# Patient Record
Sex: Male | Born: 1985 | Race: Black or African American | Hispanic: No | Marital: Single | Smoking: Never smoker
Health system: Southern US, Community
[De-identification: ages and names within clinical notes are randomized; demographics above are authoritative.]

## PROBLEM LIST (undated history)

## (undated) ENCOUNTER — Emergency Department (HOSPITAL_BASED_OUTPATIENT_CLINIC_OR_DEPARTMENT_OTHER): Payer: Self-pay | Source: Home / Self Care

## (undated) DIAGNOSIS — F1020 Alcohol dependence, uncomplicated: Secondary | ICD-10-CM

## (undated) DIAGNOSIS — K852 Alcohol induced acute pancreatitis without necrosis or infection: Secondary | ICD-10-CM

---
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
## 2014-11-05 ENCOUNTER — Ambulatory Visit (INDEPENDENT_AMBULATORY_CARE_PROVIDER_SITE_OTHER): Payer: 59 | Admitting: Family Medicine

## 2014-11-05 ENCOUNTER — Encounter (INDEPENDENT_AMBULATORY_CARE_PROVIDER_SITE_OTHER): Payer: Self-pay

## 2014-11-05 VITALS — BP 133/85 | HR 71 | Temp 98.2°F | Resp 16 | Ht 68.0 in | Wt 180.0 lb

## 2014-11-05 DIAGNOSIS — L03031 Cellulitis of right toe: Secondary | ICD-10-CM

## 2014-11-05 MED ORDER — CEPHALEXIN 500 MG PO CAPS
500.0000 mg | ORAL_CAPSULE | Freq: Four times a day (QID) | ORAL | Status: AC
Start: 2014-11-05 — End: 2014-11-15

## 2014-11-05 NOTE — Patient Instructions (Signed)
Paronychia of theFinger or Toe  Paronychia is an infection near a fingernail or toenail. It usually occurs when an opening in the cuticle or an ingrown toenail lets bacteria under the skin.  The infection will need to be drained if pus is present. If the infection has been caught early, you may need only antibiotic treatment. Healing will take about 1 to 2 weeks.  Home care  Follow these guidelines when caring for yourself at home:   Clean and soak the toe or finger. Do this twice a day for the first 3 days. To do so:   Soak your foot or hand in a tub of warm water for 5 minutes. Or hold your toe or finger under a faucet of warm running water for 5 minutes.   Clean any crust away with soap and water using a cotton swab.   Put antibiotic ointment on the infected area.   Change the dressing daily or any time it becomes soiled.   If you were given antibiotics, take them as directed until they are all gone.   If your infection is on a toe, wear comfortable shoes with a lot of toe room. You can also wear open-toed sandals while your toe heals.   You may use acetaminophen or ibuprofen to help with pain, unless another medicine was prescribed. If you have chronic liver or kidney disease, talk with your health care provider before using these medicines. Also talk with your provider if you've had a stomach ulcer or GI bleeding.  Prevention  The following can prevent paronychia:   Trim or push down the skin around the nail (cuticle).   Don't bite your nails.   Don't suck on your thumbs or fingers.  Follow-up care  Follow up with your health care provider, or as advised.  When to seek medical advice  Call your health care provider right away if any of these occur:   Redness, pain, or swelling of the finger or toe gets worse   Red streaks in the skin leading away from the wound   Pus or fluid drain from the nail area   Fever of 100.4F (38C) or higher, or as directed by your health care provider   2000-2015  [employer] [address], [place] [address]. All rights reserved. This information is not intended as a substitute for professional medical care. Always follow your healthcare professional's instructions.

## 2014-11-05 NOTE — Progress Notes (Signed)
Subjective:       Patient ID: Justin Acevedo is a 29 y.o. male.  Chief Complaint   Patient presents with   . Nail Problem     onset yesterday 11/04/2014.  right 1st toe.  pt reports getting a pedicure now c/o swelling redness and pain.  applied neosporin and took ibuprofen.         HPI Patient is here with a possible infection of the right first toe following a pedicure that was done yesterday. States that he had some soreness after the Pedicure which he attributed to the procedure but later at night he started having worsening pain and redness. Hurts to walk.     The following portions of the patient's history were reviewed and updated as appropriate: allergies, current medications, past family history, past medical history, past social history, past surgical history and problem list.    Review of Systems   Constitutional: Negative for fever, chills, activity change and fatigue.   HENT: Negative for congestion.    Respiratory: Negative for shortness of breath.    Cardiovascular: Negative for leg swelling.   Musculoskeletal: Positive for joint swelling and gait problem. Negative for myalgias and arthralgias.        See HPI   Skin: Positive for color change. Negative for wound.   Neurological: Negative for weakness and numbness.           Objective:     Physical Exam   Nursing note and vitals reviewed.  Constitutional: He appears well-developed and well-nourished. No distress.   Eyes: Conjunctivae are normal.   Neck: Normal range of motion. Neck supple.   Pulmonary/Chest: Effort normal. No respiratory distress.   Musculoskeletal:        Right foot: There is tenderness and swelling. There is normal range of motion.        Feet:    Neurological: He is alert. He exhibits normal muscle tone.   Skin: Skin is warm. There is erythema.   Psychiatric: He has a normal mood and affect.           Assessment:       Paronychia       Plan:       Abx as prescribed.  Warm soaks  Pain control with Tylenol or Ibuprofen PRN  F/U with  PMD or here if there is no improvement or earlier if worsening.   Patient voiced understanding and agreed to the plan  AVS discussed  Work note provided.recommended to wear wide toed shoes

## 2015-09-26 DIAGNOSIS — R42 Dizziness and giddiness: Secondary | ICD-10-CM | POA: Diagnosis not present

## 2015-09-26 DIAGNOSIS — J0101 Acute recurrent maxillary sinusitis: Secondary | ICD-10-CM | POA: Diagnosis not present

## 2015-09-26 DIAGNOSIS — E162 Hypoglycemia, unspecified: Secondary | ICD-10-CM | POA: Diagnosis not present

## 2015-10-17 DIAGNOSIS — G4700 Insomnia, unspecified: Secondary | ICD-10-CM | POA: Diagnosis not present

## 2015-10-17 DIAGNOSIS — I10 Essential (primary) hypertension: Secondary | ICD-10-CM | POA: Diagnosis not present

## 2015-11-15 DIAGNOSIS — I10 Essential (primary) hypertension: Secondary | ICD-10-CM | POA: Diagnosis not present

## 2015-11-15 DIAGNOSIS — E8881 Metabolic syndrome: Secondary | ICD-10-CM | POA: Diagnosis not present

## 2015-11-18 DIAGNOSIS — I10 Essential (primary) hypertension: Secondary | ICD-10-CM | POA: Diagnosis not present

## 2015-11-18 DIAGNOSIS — D72820 Lymphocytosis (symptomatic): Secondary | ICD-10-CM | POA: Diagnosis not present

## 2015-11-18 DIAGNOSIS — E785 Hyperlipidemia, unspecified: Secondary | ICD-10-CM | POA: Diagnosis not present

## 2015-11-19 DIAGNOSIS — B182 Chronic viral hepatitis C: Secondary | ICD-10-CM | POA: Diagnosis not present

## 2015-11-19 DIAGNOSIS — R700 Elevated erythrocyte sedimentation rate: Secondary | ICD-10-CM | POA: Diagnosis not present

## 2015-11-19 DIAGNOSIS — Z717 Human immunodeficiency virus [HIV] counseling: Secondary | ICD-10-CM | POA: Diagnosis not present

## 2016-04-30 DIAGNOSIS — I10 Essential (primary) hypertension: Secondary | ICD-10-CM | POA: Diagnosis not present

## 2016-04-30 DIAGNOSIS — J018 Other acute sinusitis: Secondary | ICD-10-CM | POA: Diagnosis not present

## 2016-05-11 DIAGNOSIS — J111 Influenza due to unidentified influenza virus with other respiratory manifestations: Secondary | ICD-10-CM | POA: Diagnosis not present

## 2016-05-11 DIAGNOSIS — T50905A Adverse effect of unspecified drugs, medicaments and biological substances, initial encounter: Secondary | ICD-10-CM | POA: Diagnosis not present

## 2016-05-11 DIAGNOSIS — J40 Bronchitis, not specified as acute or chronic: Secondary | ICD-10-CM | POA: Diagnosis not present

## 2016-10-16 DIAGNOSIS — I10 Essential (primary) hypertension: Secondary | ICD-10-CM | POA: Diagnosis not present

## 2016-10-16 DIAGNOSIS — Z6825 Body mass index (BMI) 25.0-25.9, adult: Secondary | ICD-10-CM | POA: Diagnosis not present

## 2016-10-16 DIAGNOSIS — J309 Allergic rhinitis, unspecified: Secondary | ICD-10-CM | POA: Diagnosis not present

## 2016-12-08 DIAGNOSIS — Z0000 Encounter for general adult medical examination without abnormal findings: Secondary | ICD-10-CM | POA: Diagnosis not present

## 2016-12-08 DIAGNOSIS — Z1389 Encounter for screening for other disorder: Secondary | ICD-10-CM | POA: Diagnosis not present

## 2016-12-08 DIAGNOSIS — Z23 Encounter for immunization: Secondary | ICD-10-CM | POA: Diagnosis not present

## 2016-12-08 DIAGNOSIS — Z6827 Body mass index (BMI) 27.0-27.9, adult: Secondary | ICD-10-CM | POA: Diagnosis not present

## 2017-01-11 DIAGNOSIS — B9689 Other specified bacterial agents as the cause of diseases classified elsewhere: Secondary | ICD-10-CM | POA: Diagnosis not present

## 2017-01-11 DIAGNOSIS — L0202 Furuncle of face: Secondary | ICD-10-CM | POA: Diagnosis not present

## 2017-12-17 DIAGNOSIS — J019 Acute sinusitis, unspecified: Secondary | ICD-10-CM | POA: Diagnosis not present

## 2017-12-17 DIAGNOSIS — Z6829 Body mass index (BMI) 29.0-29.9, adult: Secondary | ICD-10-CM | POA: Diagnosis not present

## 2018-03-14 DIAGNOSIS — Z6831 Body mass index (BMI) 31.0-31.9, adult: Secondary | ICD-10-CM | POA: Diagnosis not present

## 2018-03-14 DIAGNOSIS — J0101 Acute recurrent maxillary sinusitis: Secondary | ICD-10-CM | POA: Diagnosis not present

## 2018-03-25 ENCOUNTER — Encounter: Payer: Self-pay | Admitting: Allergy & Immunology

## 2018-03-25 ENCOUNTER — Ambulatory Visit (INDEPENDENT_AMBULATORY_CARE_PROVIDER_SITE_OTHER): Payer: BLUE CROSS/BLUE SHIELD | Admitting: Allergy & Immunology

## 2018-03-25 VITALS — BP 140/82 | HR 74 | Temp 98.4°F | Resp 18 | Ht 66.75 in | Wt 203.0 lb

## 2018-03-25 DIAGNOSIS — J302 Other seasonal allergic rhinitis: Secondary | ICD-10-CM

## 2018-03-25 DIAGNOSIS — T485X5A Adverse effect of other anti-common-cold drugs, initial encounter: Secondary | ICD-10-CM

## 2018-03-25 DIAGNOSIS — J3089 Other allergic rhinitis: Secondary | ICD-10-CM

## 2018-03-25 DIAGNOSIS — J310 Chronic rhinitis: Secondary | ICD-10-CM | POA: Diagnosis not present

## 2018-03-25 MED ORDER — AZELASTINE HCL 0.1 % NA SOLN: 2.0000 | Freq: Two times a day (BID) | NASAL | 5 refills | Status: DC

## 2018-03-25 MED ORDER — MONTELUKAST SODIUM 10 MG PO TABS: 10.0000 mg | ORAL_TABLET | Freq: Every day | ORAL | 5 refills | Status: DC

## 2018-03-25 NOTE — Patient Instructions (Addendum)
1. Seasonal and perennial allergic rhinitis - Testing today showed: ragweed, weeds, grasses, indoor molds, outdoor molds, cat and dog - Copy of test results provided.  - Avoidance measures provided. - Continue with: Flonase (fluticasone) two sprays per nostril daily - Start taking: Singulair (montelukast) 10mg  daily and Astelin (azelastine) 2 sprays per nostril 1-2 times daily as needed - You can use an extra dose of the antihistamine, if needed, for breakthrough symptoms.  - Consider nasal saline rinses 1-2 times daily to remove allergens from the nasal cavities as well as help with mucous clearance (this is especially helpful to do before the nasal sprays are given) - Consider allergy shots as a means of long-term control. - Allergy shots "re-train" and "reset" the immune system to ignore environmental allergens and decrease the resulting immune response to those allergens (sneezing, itchy watery eyes, runny nose, nasal congestion, etc).    - Allergy shots improve symptoms in 75-85% of patients.  - We can discuss more at the next appointment if the medications are not working for you.  2. Rhinitis medicamentosa - We are going to have to wean the Afrin since you have been on it for so long.  - Use Afrin daily for two weeks. - Then use Afrin every other day for two weeks. - Then use Afrin three times weekly (Monday/Wednesday/Friday) for two weeks. - Then STOP.   3. Return in about 3 months (around 06/24/2018).    Please inform us of any Emergency Department visits, hospitalizations, or changes in symptoms. Call us before going to the ED for breathing or allergy symptoms since we might be able to fit you in for a sick visit. Feel free to contact us anytime with any questions, problems, or concerns.  It was a pleasure to meet you today!  Websites that have reliable patient information: 1. American Academy of Asthma, Allergy, and Immunology: www.aaaai.org 2. Food Allergy Research and  Education (FARE): foodallergy.org 3. Mothers of Asthmatics: http://www.asthmacommunitynetwork.org 4. American College of Allergy, Asthma, and Immunology: MissingWeapons.cawww.acaai.org   Make sure you are registered to vote! If you have moved or changed any of your contact information, you will need to get this updated before voting!      Reducing Pollen Exposure  The American Academy of Allergy, Asthma and Immunology suggests the following steps to reduce your exposure to pollen during allergy seasons.    1. Do not hang sheets or clothing out to dry; pollen may collect on these items. 2. Do not mow lawns or spend time around freshly cut grass; mowing stirs up pollen. 3. Keep windows closed at night.  Keep car windows closed while driving. 4. Minimize morning activities outdoors, a time when pollen counts are usually at their highest. 5. Stay indoors as much as possible when pollen counts or humidity is high and on windy days when pollen tends to remain in the air longer. 6. Use air conditioning when possible.  Many air conditioners have filters that trap the pollen spores. 7. Use a HEPA room air filter to remove pollen form the indoor air you breathe.  Control of Mold Allergen   Mold and fungi can grow on a variety of surfaces provided certain temperature and moisture conditions exist.  Outdoor molds grow on plants, decaying vegetation and soil.  The major outdoor mold, Alternaria and Cladosporium, are found in very high numbers during hot and dry conditions.  Generally, a late Summer - Fall peak is seen for common outdoor fungal spores.  Rain will  temporarily lower outdoor mold spore count, but counts rise rapidly when the rainy period ends.  The most important indoor molds are Aspergillus and Penicillium.  Dark, humid and poorly ventilated basements are ideal sites for mold growth.  The next most common sites of mold growth are the bathroom and the kitchen.  Outdoor (Seasonal) Mold Control  Positive  outdoor molds via skin testing: Alternaria, Cladosporium, Bipolaris (Helminthsporium), Drechslera (Curvalaria) and Mucor  1. Use air conditioning and keep windows closed 2. Avoid exposure to decaying vegetation. 3. Avoid leaf raking. 4. Avoid grain handling. 5. Consider wearing a face mask if working in moldy areas.  6.   Indoor (Perennial) Mold Control   Positive indoor molds via skin testing: Aspergillus, Penicillium, Fusarium, Aureobasidium (Pullulara) and Rhizopus  1. Maintain humidity below 50%. 2. Clean washable surfaces with 5% bleach solution. 3. Remove sources e.g. contaminated carpets.     Control of Dog or Cat Allergen  Avoidance is the best way to manage a dog or cat allergy. If you have a dog or cat and are allergic to dog or cats, consider removing the dog or cat from the home. If you have a dog or cat but don't want to find it a new home, or if your family wants a pet even though someone in the household is allergic, here are some strategies that may help keep symptoms at bay:  1. Keep the pet out of your bedroom and restrict it to only a few rooms. Be advised that keeping the dog or cat in only one room will not limit the allergens to that room. 2. Don't pet, hug or kiss the dog or cat; if you do, wash your hands with soap and water. 3. High-efficiency particulate air (HEPA) cleaners run continuously in a bedroom or living room can reduce allergen levels over time. 4. Regular use of a high-efficiency vacuum cleaner or a central vacuum can reduce allergen levels. 5. Giving your dog or cat a bath at least once a week can reduce airborne allergen.  Allergy Shots   Allergies are the result of a chain reaction that starts in the immune system. Your immune system controls how your body defends itself. For instance, if you have an allergy to pollen, your immune system identifies pollen as an invader or allergen. Your immune system overreacts by producing antibodies called  Immunoglobulin E (IgE). These antibodies travel to cells that release chemicals, causing an allergic reaction.  The concept behind allergy immunotherapy, whether it is received in the form of shots or tablets, is that the immune system can be desensitized to specific allergens that trigger allergy symptoms. Although it requires time and patience, the payback can be long-term relief.  How Do Allergy Shots Work?  Allergy shots work much like a vaccine. Your body responds to injected amounts of a particular allergen given in increasing doses, eventually developing a resistance and tolerance to it. Allergy shots can lead to decreased, minimal or no allergy symptoms.  There generally are two phases: build-up and maintenance. Build-up often ranges from three to six months and involves receiving injections with increasing amounts of the allergens. The shots are typically given once or twice a week, though more rapid build-up schedules are sometimes used.  The maintenance phase begins when the most effective dose is reached. This dose is different for each person, depending on how allergic you are and your response to the build-up injections. Once the maintenance dose is reached, there are longer periods between injections,  typically two to four weeks.  Occasionally doctors give cortisone-type shots that can temporarily reduce allergy symptoms. These types of shots are different and should not be confused with allergy immunotherapy shots.  Who Can Be Treated with Allergy Shots?  Allergy shots may be a good treatment approach for people with allergic rhinitis (hay fever), allergic asthma, conjunctivitis (eye allergy) or stinging insect allergy.   Before deciding to begin allergy shots, you should consider:  . The length of allergy season and the severity of your symptoms . Whether medications and/or changes to your environment can control your symptoms . Your desire to avoid long-term medication use .  Time: allergy immunotherapy requires a major time commitment . Cost: may vary depending on your insurance coverage  Allergy shots for children age 64 and older are effective and often well tolerated. They might prevent the onset of new allergen sensitivities or the progression to asthma.  Allergy shots are not started on patients who are pregnant but can be continued on patients who become pregnant while receiving them. In some patients with other medical conditions or who take certain common medications, allergy shots may be of risk. It is important to mention other medications you talk to your allergist.   When Will I Feel Better?  Some may experience decreased allergy symptoms during the build-up phase. For others, it may take as long as 12 months on the maintenance dose. If there is no improvement after a year of maintenance, your allergist will discuss other treatment options with you.  If you aren't responding to allergy shots, it may be because there is not enough dose of the allergen in your vaccine or there are missing allergens that were not identified during your allergy testing. Other reasons could be that there are high levels of the allergen in your environment or major exposure to non-allergic triggers like tobacco smoke.  What Is the Length of Treatment?  Once the maintenance dose is reached, allergy shots are generally continued for three to five years. The decision to stop should be discussed with your allergist at that time. Some people may experience a permanent reduction of allergy symptoms. Others may relapse and a longer course of allergy shots can be considered.  What Are the Possible Reactions?  The two types of adverse reactions that can occur with allergy shots are local and systemic. Common local reactions include very mild redness and swelling at the injection site, which can happen immediately or several hours after. A systemic reaction, which is less common,  affects the entire body or a particular body system. They are usually mild and typically respond quickly to medications. Signs include increased allergy symptoms such as sneezing, a stuffy nose or hives.  Rarely, a serious systemic reaction called anaphylaxis can develop. Symptoms include swelling in the throat, wheezing, a feeling of tightness in the chest, nausea or dizziness. Most serious systemic reactions develop within 30 minutes of allergy shots. This is why it is strongly recommended you wait in your doctor's office for 30 minutes after your injections. Your allergist is trained to watch for reactions, and his or her staff is trained and equipped with the proper medications to identify and treat them.  Who Should Administer Allergy Shots?  The preferred location for receiving shots is your prescribing allergist's office. Injections can sometimes be given at another facility where the physician and staff are trained to recognize and treat reactions, and have received instructions by your prescribing allergist.

## 2018-03-25 NOTE — Progress Notes (Signed)
NEW PATIENT  Date of Service/Encounter:  03/25/18  Referring provider: Richardean Prince, Angel G, MD   Assessment:   Seasonal and perennial allergic rhinitis (ragweed, weeds, grasses, indoor molds, outdoor molds, cat and dog)  Rhinitis medicamentosa  Plan/Recommendations:   1. Seasonal and perennial allergic rhinitis - Testing today showed: ragweed, weeds, grasses, indoor molds, outdoor molds, cat and dog - Copy of test results provided.  - Avoidance measures provided. - Continue with: Flonase (fluticasone) two sprays per nostril daily - Start taking: Singulair (montelukast) 10mg  daily and Astelin (azelastine) 2 sprays per nostril 1-2 times daily as needed - You can use an extra dose of the antihistamine, if needed, for breakthrough symptoms.  - Consider nasal saline rinses 1-2 times daily to remove allergens from the nasal cavities as well as help with mucous clearance (this is especially helpful to do before the nasal sprays are given) - Consider allergy shots as a means of long-term control. - Allergy shots "re-train" and "reset" the immune system to ignore environmental allergens and decrease the resulting immune response to those allergens (sneezing, itchy watery eyes, runny nose, nasal congestion, etc).    - Allergy shots improve symptoms in 75-85% of patients.  - We can discuss more at the next appointment if the medications are not working for you.  2. Rhinitis medicamentosa - We are going to have to wean the Afrin since you have been on it for so long.  - Use Afrin daily for two weeks. - Then use Afrin every other day for two weeks. - Then use Afrin three times weekly (Monday/Wednesday/Friday) for two weeks. - Then STOP.   3. Return in about 3 months (around 06/24/2018).  Subjective:   Angel Prince is a 32 y.o. male presenting today for evaluation of  Chief Complaint  Patient presents with  . Nasal Congestion  . Sinusitis    Angel Prince has a history of the  following: Patient Active Problem List   Diagnosis Date Noted  . Seasonal and perennial allergic rhinitis 03/26/2018  . Rhinitis medicamentosa 03/26/2018    History obtained from: chart review and patient.  Angel Prince was referred by Angel Prince, Angel G, MD.     Angel Prince is a 32 y.o. male presenting for an evaluation of chronic rhinitis and chronic Afrin use. He has been using this for quite some time. He is using the Afrin up to multiple times daily. He has had nasal congestion for a few years. He did not really have allergies when he was a child. He is from this area. He is on Flonase every day as well. He did start this recently within the last few months. Symptoms are always fairly terrible. He is not us9ing any antihistamines since these have not seemed ot have helped.  He is currently on antibiotics and prednisone for a sinus infections. Symptoms started two weeks ago. He has been treated twice for a sinus infection in the last twelve months. He has no history of other infections.   Angel Prince has never needed an inhaler. He tolerates all of the major food allergens without adverse event.  Otherwise, there is no history of other atopic diseases, including asthma, food allergies, drug allergies, stinging insect allergies, eczema or urticaria. There is no significant infectious history. Vaccinations are up to date.    Past Medical History: Patient Active Problem List   Diagnosis Date Noted  . Seasonal and perennial allergic rhinitis 03/26/2018  . Rhinitis medicamentosa 03/26/2018    Medication List:  Allergies  as of 03/25/2018   Not on File     Medication List        Accurate as of 03/25/18 11:59 PM. Always use your most recent med list.          azelastine 0.1 % nasal spray Commonly known as:  ASTELIN Place 2 sprays into both nostrils 2 (two) times daily.   fluticasone 50 MCG/ACT nasal spray Commonly known as:  FLONASE Place 2 sprays into both nostrils 2 (two) times daily.    montelukast 10 MG tablet Commonly known as:  SINGULAIR Take 1 tablet (10 mg total) by mouth at bedtime.   oxymetazoline 0.05 % nasal spray Commonly known as:  AFRIN Place 2 sprays into both nostrils 2 (two) times daily.       Birth History: non-contributory  Developmental History: non-contributory.   Past Surgical History: History reviewed. No pertinent surgical history.   Family History: History reviewed. No pertinent family history.   Social History: Angel Prince lives at home. He lives in a house that is 30 years old. There are wood floors throughout the home. There is gas and electric heating with window units for cooling. There are dogs inside and outside of the home. There are no dust mite coverings on the bedding. There is no tobacco exposure. He currently works in a factor that [employer]. He is not aware of the chemicals used, but he does note that symptoms are better on the weekends.    Review of Systems: a 14-point review of systems is pertinent for what is mentioned in HPI.  Otherwise, all other systems were negative. Constitutional: negative other than that listed in the HPI Eyes: negative other than that listed in the HPI Ears, nose, mouth, throat, and face: negative other than that listed in the HPI Respiratory: negative other than that listed in the HPI Cardiovascular: negative other than that listed in the HPI Gastrointestinal: negative other than that listed in the HPI Genitourinary: negative other than that listed in the HPI Integument: negative other than that listed in the HPI Hematologic: negative other than that listed in the HPI Musculoskeletal: negative other than that listed in the HPI Neurological: negative other than that listed in the HPI Allergy/Immunologic: negative other than that listed in the HPI    Objective:   Blood pressure 140/82, pulse 74, temperature 98.4 F (36.9 C), temperature source Oral, resp. rate 18, height 5' 6.75" (1.695  m), weight 203 lb (92.1 kg), SpO2 98 %. Body mass index is 32.03 kg/m.   Physical Exam:  General: Alert, interactive, in no acute distress. Pleasant and talkative.  Eyes: No conjunctival injection bilaterally, no discharge on the right, no discharge on the left and no Horner-Trantas dots present. PERRL bilaterally. EOMI without pain. No photophobia.  Ears: Right TM pearly gray with normal light reflex, Left TM pearly gray with normal light reflex, Right TM intact without perforation and Left TM intact without perforation.  Nose/Throat: External nose within normal limits and septum midline. Turbinates edematous with clear discharge. Posterior oropharynx mildly erythematous without cobblestoning in the posterior oropharynx. Tonsils 2+ without exudates.  Tongue without thrush. Neck: Supple without thyromegaly. Trachea midline. Adenopathy: no enlarged lymph nodes appreciated in the anterior cervical, occipital, axillary, epitrochlear, inguinal, or popliteal regions. Lungs: Clear to auscultation without wheezing, rhonchi or rales. No increased work of breathing. CV: Normal S1/S2. No murmurs. Capillary refill <2 seconds.  Abdomen: Nondistended, nontender. No guarding or rebound tenderness. Bowel sounds absent   Skin: Warm and dry,  without lesions or rashes. Extremities:  No clubbing, cyanosis or edema. Neuro:   Grossly intact. No focal deficits appreciated. Responsive to questions.  Diagnostic studies:     Allergy Studies:  Airborne Adult Perc - 03/25/18 1426    Time Antigen Placed  1435    Allergen Manufacturer  Waynette Buttery    Location  Back    Number of Test  59    Panel 1  Select    1. Control-Buffer 50% Glycerol  Negative    2. Control-Histamine 1 mg/ml  2+    3. Albumin saline  Negative    4. Bahia  Negative    5. [place]  Negative    6. Johnson  Negative    7. Kentucky Blue  Negative    8. Meadow Fescue  Negative    9. Perennial Rye  Negative    10. Sweet Vernal  Negative    11.  Timothy  Negative    12. Cocklebur  Negative    13. Burweed Marshelder  Negative    14. Ragweed, short  Negative    15. Ragweed, Giant  Negative    16. Plantain,  English  Negative    17. Lamb's Quarters  Negative    18. Sheep Sorrell  Negative    19. Rough Pigweed  Negative    20. Marsh Elder, Rough  Negative    21. Mugwort, Common  Negative    22. Ash mix  Negative    23. Birch mix  Negative    24. Beech American  Negative    25. Box, Elder  Negative    26. Cedar, red  Negative    27. Cottonwood, Guinea-Bissau  Negative    28. Elm mix  Negative    29. Hickory mix  Negative    30. Maple mix  Negative    31. Oak, Guinea-Bissau mix  Negative    32. Pecan Pollen  Negative    33. Pine mix  Negative    34. Sycamore Eastern  Negative    35. Walnut, Black Pollen  Negative    36. Alternaria alternata  Negative    37. Cladosporium Herbarum  Negative    38. Aspergillus mix  Negative    39. Penicillium mix  Negative    40. Bipolaris sorokiniana (Helminthosporium)  Negative    41. Drechslera spicifera (Curvularia)  Negative    42. Mucor plumbeus  Negative    43. Fusarium moniliforme  Negative    44. Aureobasidium pullulans (pullulara)  Negative    45. Rhizopus oryzae  Negative    46. Botrytis cinera  Negative    47. Epicoccum nigrum  Negative    48. Phoma betae  Negative    49. Candida Albicans  Negative    50. Trichophyton mentagrophytes  Negative    51. Mite, D Farinae  5,000 AU/ml  Negative    52. Mite, D Pteronyssinus  5,000 AU/ml  Negative    53. Cat Hair 10,000 BAU/ml  Negative    54.  Dog Epithelia  Negative    55. Mixed Feathers  Negative    56. Horse Epithelia  Negative    57. Cockroach, German  Negative    58. Mouse  Negative    59. Tobacco Leaf  Negative     Intradermal - 03/25/18 1456    Time Antigen Placed  1507    Allergen Manufacturer  Waynette Buttery    Location  Arm    Number of Test  15  Intradermal  Select    Control  Negative    [place]  1+    Johnson  1+    7 Grass   1+    Ragweed mix  1+    Weed mix  Negative    Tree mix  Negative    Mold 1  1+    Mold 2  1+    Mold 3  1+    Mold 4  3+    Cat  2+    Dog  3+    Cockroach  Negative    Mite mix  Negative        Allergy testing results were read and interpreted by myself, documented by clinical staff.       Malachi Bonds, MD [facility]

## 2018-03-26 ENCOUNTER — Encounter: Payer: Self-pay | Admitting: Allergy & Immunology

## 2018-03-26 DIAGNOSIS — T485X5A Adverse effect of other anti-common-cold drugs, initial encounter: Secondary | ICD-10-CM

## 2018-03-26 DIAGNOSIS — J3089 Other allergic rhinitis: Principal | ICD-10-CM

## 2018-03-26 DIAGNOSIS — J302 Other seasonal allergic rhinitis: Secondary | ICD-10-CM | POA: Insufficient documentation

## 2018-03-26 DIAGNOSIS — J310 Chronic rhinitis: Secondary | ICD-10-CM | POA: Insufficient documentation

## 2018-06-24 ENCOUNTER — Ambulatory Visit: Payer: BLUE CROSS/BLUE SHIELD | Admitting: Allergy & Immunology

## 2018-06-29 ENCOUNTER — Ambulatory Visit (INDEPENDENT_AMBULATORY_CARE_PROVIDER_SITE_OTHER): Payer: BLUE CROSS/BLUE SHIELD | Admitting: Allergy & Immunology

## 2018-06-29 ENCOUNTER — Encounter: Payer: Self-pay | Admitting: Allergy & Immunology

## 2018-06-29 ENCOUNTER — Other Ambulatory Visit: Payer: Self-pay

## 2018-06-29 VITALS — BP 116/70 | HR 71 | Resp 16

## 2018-06-29 DIAGNOSIS — J3089 Other allergic rhinitis: Secondary | ICD-10-CM

## 2018-06-29 DIAGNOSIS — J302 Other seasonal allergic rhinitis: Secondary | ICD-10-CM

## 2018-06-29 DIAGNOSIS — J310 Chronic rhinitis: Secondary | ICD-10-CM

## 2018-06-29 DIAGNOSIS — T485X5A Adverse effect of other anti-common-cold drugs, initial encounter: Secondary | ICD-10-CM | POA: Diagnosis not present

## 2018-06-29 NOTE — Progress Notes (Signed)
FOLLOW UP  Date of Service/Encounter:  06/29/18   Assessment:   Seasonal and perennial allergic rhinitis (ragweed, weeds, grasses, indoor molds, outdoor molds, cat and dog)  Rhinitis medicamentosa - improved   [name] is doing much better today.  He is no longer using his Afrin on a hourly basis.  His combination of nasal sprays has worked well.  Although he does use Afrin a few times per week, I think it is much better than it was before.  His symptoms seem well controlled with the current combination of medications, so we can avoid allergen immunotherapy at this time.  We can discuss this again in the future if indicated.   Plan/Recommendations:   1. Seasonal and perennial allergic rhinitis (ragweed, weeds, grasses, indoor molds, outdoor molds, cat and dog) - Continue with: Singulair (montelukast) 10mg  daily, Flonase (fluticasone) two sprays per nostril daily and Astelin (azelastine) 2 sprays per nostril 1-2 times daily as needed - Use salt water rinses as needed.  - We can avoid allergy shots for now, but we can revisit that in the future if needed.   3. Return in about 1 year (around 06/29/2019).  Subjective:   Angel Prince is a 33 y.o. male presenting today for follow up of  Chief Complaint  Patient presents with  . Follow-up    [name] has a history of the following: Patient Active Problem List   Diagnosis Date Noted  . Seasonal and perennial allergic rhinitis 03/26/2018  . Rhinitis medicamentosa 03/26/2018    History obtained from: chart review and patient.  Angel Prince is a 33 y.o. male presenting for a follow up visit.  He was last seen in December 2019.  At that time, he had testing that was positive to ragweed, weeds, grasses, indoor molds, outdoor molds, cat, and dog.  We continued Flonase and added Astelin and Singulair.  He was using Afrin quite a bit, so we recommended weaning that over the course of a month.  Since last visit, he has done very  well.  He is using the fluticasone and Astelin as recommended.  He does not appreciate the flavor of the Astelin, but he continues to use it because it is improving his symptoms.  He is using the Afrin only a few times per week now, which is much better than last time we saw him.  He is also on montelukast 10 mg daily, which is also helping.  He thinks he does not need allergy shots at this point.  Otherwise, there have been no changes to his past medical history, surgical history, family history, or social history.    Review of Systems  Constitutional: Negative.  Negative for fever, malaise/fatigue and weight loss.  HENT: Negative.  Negative for congestion, ear discharge and ear pain.   Eyes: Negative for pain, discharge and redness.  Respiratory: Negative for cough, sputum production, shortness of breath and wheezing.   Cardiovascular: Negative.  Negative for chest pain and palpitations.  Gastrointestinal: Negative for abdominal pain and heartburn.  Skin: Negative.  Negative for itching and rash.  Neurological: Negative for dizziness and headaches.  Endo/Heme/Allergies: Negative for environmental allergies. Does not bruise/bleed easily.       Objective:   Blood pressure 116/70, pulse 71, resp. rate 16, SpO2 98 %. There is no height or weight on file to calculate BMI.   Physical Exam:  Physical Exam  Constitutional: He appears well-developed.  HENT:  Head: Normocephalic and atraumatic.  Right Ear: Tympanic membrane, external  ear and ear canal normal.  Left Ear: Tympanic membrane and ear canal normal.  Nose: Mucosal edema present. No rhinorrhea, nasal deformity or septal deviation. No epistaxis. Right sinus exhibits no maxillary sinus tenderness and no frontal sinus tenderness. Left sinus exhibits no maxillary sinus tenderness and no frontal sinus tenderness.  Mouth/Throat: Uvula is midline and oropharynx is clear and moist. Mucous membranes are not pale and not dry.  No epistaxis  present.  No deviated septum.  Turbinates appear mostly normal, although slightly edematous.  There is no postnasal drip appreciated.  Eyes: Pupils are equal, round, and reactive to light. Conjunctivae and EOM are normal. Right eye exhibits no chemosis and no discharge. Left eye exhibits no chemosis and no discharge. Right conjunctiva is not injected. Left conjunctiva is not injected.  Cardiovascular: Normal rate, regular rhythm and normal heart sounds.  Respiratory: Effort normal and breath sounds normal. No accessory muscle usage. No tachypnea. No respiratory distress. He has no wheezes. He has no rhonchi. He has no rales. He exhibits no tenderness.  Lymphadenopathy:    He has no cervical adenopathy.  Neurological: He is alert.  Skin: No abrasion, no petechiae and no rash noted. Rash is not papular, not vesicular and not urticarial. No erythema. No pallor.  Psychiatric: He has a normal mood and affect.     Diagnostic studies: none   Malachi Bonds, MD  [facility]

## 2018-06-29 NOTE — Patient Instructions (Addendum)
1. Seasonal and perennial allergic rhinitis (ragweed, weeds, grasses, indoor molds, outdoor molds, cat and dog) - Continue with: Singulair (montelukast) 10mg  daily, Flonase (fluticasone) two sprays per nostril daily and Astelin (azelastine) 2 sprays per nostril 1-2 times daily as needed - Use salt water rinses as needed.  - We can avoid allergy shots for now, but we can revisit that in the future if needed.   3. Return in about 1 year (around 06/29/2019).   Please inform us of any Emergency Department visits, hospitalizations, or changes in symptoms. Call us before going to the ED for breathing or allergy symptoms since we might be able to fit you in for a sick visit. Feel free to contact us anytime with any questions, problems, or concerns.  It was a pleasure to see you again today!  Websites that have reliable patient information: 1. American Academy of Asthma, Allergy, and Immunology: www.aaaai.org 2. Food Allergy Research and Education (FARE): foodallergy.org 3. Mothers of Asthmatics: http://www.asthmacommunitynetwork.org 4. American College of Allergy, Asthma, and Immunology: MissingWeapons.ca   Make sure you are registered to vote! If you have moved or changed any of your contact information, you will need to get this updated before voting!    Voter ID laws are NOT going into effect for the General Election in November 2020! DO NOT let this stop you from exercising your right to vote!

## 2018-09-29 DIAGNOSIS — L700 Acne vulgaris: Secondary | ICD-10-CM | POA: Diagnosis not present

## 2019-06-16 DIAGNOSIS — Z0000 Encounter for general adult medical examination without abnormal findings: Secondary | ICD-10-CM | POA: Diagnosis not present

## 2019-06-16 DIAGNOSIS — R7989 Other specified abnormal findings of blood chemistry: Secondary | ICD-10-CM | POA: Diagnosis not present

## 2019-06-16 DIAGNOSIS — E7800 Pure hypercholesterolemia, unspecified: Secondary | ICD-10-CM | POA: Diagnosis not present

## 2019-06-16 DIAGNOSIS — Z6832 Body mass index (BMI) 32.0-32.9, adult: Secondary | ICD-10-CM | POA: Diagnosis not present

## 2019-06-20 ENCOUNTER — Other Ambulatory Visit: Payer: Self-pay

## 2019-06-20 MED ORDER — AZELASTINE HCL 0.1 % NA SOLN: 2.0000 | Freq: Two times a day (BID) | NASAL | 0 refills | Status: DC

## 2019-06-20 MED ORDER — MONTELUKAST SODIUM 10 MG PO TABS: 10.0000 mg | ORAL_TABLET | Freq: Every day | ORAL | 0 refills | Status: DC

## 2019-06-23 DIAGNOSIS — R945 Abnormal results of liver function studies: Secondary | ICD-10-CM | POA: Diagnosis not present

## 2019-06-23 DIAGNOSIS — R7989 Other specified abnormal findings of blood chemistry: Secondary | ICD-10-CM | POA: Diagnosis not present

## 2019-07-04 ENCOUNTER — Ambulatory Visit: Payer: Self-pay | Admitting: Allergy & Immunology

## 2019-07-06 ENCOUNTER — Ambulatory Visit: Payer: Self-pay | Admitting: Allergy & Immunology

## 2019-07-07 ENCOUNTER — Encounter: Payer: Self-pay | Admitting: Allergy & Immunology

## 2019-07-07 ENCOUNTER — Ambulatory Visit (INDEPENDENT_AMBULATORY_CARE_PROVIDER_SITE_OTHER): Payer: BC Managed Care – PPO | Admitting: Allergy & Immunology

## 2019-07-07 ENCOUNTER — Other Ambulatory Visit: Payer: Self-pay

## 2019-07-07 VITALS — BP 136/70 | HR 60 | Temp 98.1°F | Resp 18

## 2019-07-07 DIAGNOSIS — J3089 Other allergic rhinitis: Secondary | ICD-10-CM | POA: Diagnosis not present

## 2019-07-07 DIAGNOSIS — J302 Other seasonal allergic rhinitis: Secondary | ICD-10-CM

## 2019-07-07 DIAGNOSIS — T485X5A Adverse effect of other anti-common-cold drugs, initial encounter: Secondary | ICD-10-CM | POA: Diagnosis not present

## 2019-07-07 DIAGNOSIS — J310 Chronic rhinitis: Secondary | ICD-10-CM | POA: Diagnosis not present

## 2019-07-07 MED ORDER — AZELASTINE HCL 0.1 % NA SOLN: 2.0000 | Freq: Two times a day (BID) | NASAL | 5 refills | Status: DC

## 2019-07-07 MED ORDER — FLUTICASONE PROPIONATE 50 MCG/ACT NA SUSP: 2.0000 | Freq: Two times a day (BID) | NASAL | 5 refills | Status: DC

## 2019-07-07 NOTE — Patient Instructions (Addendum)
1. Seasonal and perennial allergic rhinitis (ragweed, weeds, grasses, indoor molds, outdoor molds, cat and dog) - Continue with: Singulair (montelukast) 10mg  daily, Flonase (fluticasone) two sprays per nostril daily and Astelin (azelastine) 2 sprays per nostril 1-2 times daily as needed - Use salt water rinses as needed.  - We can avoid allergy shots for now, but we can revisit that in the future if needed.   2. Return in about 1 year (around 07/06/2020). This can be an in-person, a virtual Webex or a telephone follow up visit.   Please inform us of any Emergency Department visits, hospitalizations, or changes in symptoms. Call us before going to the ED for breathing or allergy symptoms since we might be able to fit you in for a sick visit. Feel free to contact us anytime with any questions, problems, or concerns.  It was a pleasure to see you again today!  Websites that have reliable patient information: 1. American Academy of Asthma, Allergy, and Immunology: www.aaaai.org 2. Food Allergy Research and Education (FARE): foodallergy.org 3. Mothers of Asthmatics: http://www.asthmacommunitynetwork.org 4. American College of Allergy, Asthma, and Immunology: www.acaai.org   COVID-19 Vaccine Information can be found at: ShippingScam.co.uk For questions related to vaccine distribution or appointments, please email [email or web address] or call [phone].     "Like" [place] on Facebook and Instagram for our latest updates!        Make sure you are registered to vote! If you have moved or changed any of your contact information, you will need to get this updated before voting!  In some cases, you MAY be able to register to vote online: [email or web address]     Allergy Shots   Allergies are the result of a chain reaction that starts in the immune system. Your immune system controls how your body defends  itself. For instance, if you have an allergy to pollen, your immune system identifies pollen as an invader or allergen. Your immune system overreacts by producing antibodies called Immunoglobulin E (IgE). These antibodies travel to cells that release chemicals, causing an allergic reaction.  The concept behind allergy immunotherapy, whether it is received in the form of shots or tablets, is that the immune system can be desensitized to specific allergens that trigger allergy symptoms. Although it requires time and patience, the payback can be long-term relief.  How Do Allergy Shots Work?  Allergy shots work much like a vaccine. Your body responds to injected amounts of a particular allergen given in increasing doses, eventually developing a resistance and tolerance to it. Allergy shots can lead to decreased, minimal or no allergy symptoms.  There generally are two phases: build-up and maintenance. Build-up often ranges from three to six months and involves receiving injections with increasing amounts of the allergens. The shots are typically given once or twice a week, though more rapid build-up schedules are sometimes used.  The maintenance phase begins when the most effective dose is reached. This dose is different for each person, depending on how allergic you are and your response to the build-up injections. Once the maintenance dose is reached, there are longer periods between injections, typically two to four weeks.  Occasionally doctors give cortisone-type shots that can temporarily reduce allergy symptoms. These types of shots are different and should not be confused with allergy immunotherapy shots.  Who Can Be Treated with Allergy Shots?  Allergy shots may be a good treatment approach for people with allergic rhinitis (hay fever), allergic asthma, conjunctivitis (eye allergy) or stinging insect allergy.   Before  deciding to begin allergy shots, you should consider:  . The length of  allergy season and the severity of your symptoms . Whether medications and/or changes to your environment can control your symptoms . Your desire to avoid long-term medication use . Time: allergy immunotherapy requires a major time commitment . Cost: may vary depending on your insurance coverage  Allergy shots for children age 66 and older are effective and often well tolerated. They might prevent the onset of new allergen sensitivities or the progression to asthma.  Allergy shots are not started on patients who are pregnant but can be continued on patients who become pregnant while receiving them. In some patients with other medical conditions or who take certain common medications, allergy shots may be of risk. It is important to mention other medications you talk to your allergist.   When Will I Feel Better?  Some may experience decreased allergy symptoms during the build-up phase. For others, it may take as long as 12 months on the maintenance dose. If there is no improvement after a year of maintenance, your allergist will discuss other treatment options with you.  If you aren't responding to allergy shots, it may be because there is not enough dose of the allergen in your vaccine or there are missing allergens that were not identified during your allergy testing. Other reasons could be that there are high levels of the allergen in your environment or major exposure to non-allergic triggers like tobacco smoke.  What Is the Length of Treatment?  Once the maintenance dose is reached, allergy shots are generally continued for three to five years. The decision to stop should be discussed with your allergist at that time. Some people may experience a permanent reduction of allergy symptoms. Others may relapse and a longer course of allergy shots can be considered.  What Are the Possible Reactions?  The two types of adverse reactions that can occur with allergy shots are local and systemic.  Common local reactions include very mild redness and swelling at the injection site, which can happen immediately or several hours after. A systemic reaction, which is less common, affects the entire body or a particular body system. They are usually mild and typically respond quickly to medications. Signs include increased allergy symptoms such as sneezing, a stuffy nose or hives.  Rarely, a serious systemic reaction called anaphylaxis can develop. Symptoms include swelling in the throat, wheezing, a feeling of tightness in the chest, nausea or dizziness. Most serious systemic reactions develop within 30 minutes of allergy shots. This is why it is strongly recommended you wait in your doctor's office for 30 minutes after your injections. Your allergist is trained to watch for reactions, and his or her staff is trained and equipped with the proper medications to identify and treat them.  Who Should Administer Allergy Shots?  The preferred location for receiving shots is your prescribing allergist's office. Injections can sometimes be given at another facility where the physician and staff are trained to recognize and treat reactions, and have received instructions by your prescribing allergist.

## 2019-07-07 NOTE — Progress Notes (Signed)
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  This is much better than every 1-2 hours as he was doing when I first met him.  We are not can make any medication changes at this time, but I encouraged him to continue weaning the Afrin as tolerated.  We also discussed allergen immunotherapy as a means of long-term control.  [place] is on his way back from work, so he could stop by after leaving work for his shots before going home to eat in.  We did give him information on this and is going to check his insurance company to see on the co-pays.  Plan/Recommendations:   1. Seasonal and perennial allergic rhinitis (ragweed, weeds, grasses, indoor molds, outdoor molds, cat and dog) - Continue with: Singulair (montelukast) 44m daily, Flonase (fluticasone) two sprays per nostril daily and Astelin (azelastine) 2 sprays per nostril 1-2 times daily as needed - Use salt water rinses as needed.  - We can avoid allergy shots for now, but we can revisit that in the future if needed.   2. Return in about 1 year (around 07/06/2020). This can be an in-person, a virtual Webex or a telephone follow up visit.   Subjective:   MKarron Alvizois a 34y.o. male presenting today for follow up of  Chief Complaint  Patient presents with  . Follow-up  . Allergies    MKaid Seebergerhas a history of the following: Patient Active Problem List   Diagnosis Date Noted  . Seasonal and perennial allergic rhinitis 03/26/2018  . Rhinitis medicamentosa 03/26/2018    History obtained from: chart review and patient.  MGayleis a 34y.o. male presenting for a follow up visit.  He was last seen in March  2020.  At that time, his Afrin use was decreased.  He was doing well on Singulair, Flonase, and Astelin.  He was using saltwater rinses as needed.  Since last visit, he has done well.  He still uses Afrin once a day, although he will skip several days occasionally.  Originally when I saw him, he was using it every 1-2 hours.  Allergic Rhinitis Symptom History: He remains on the montelukast as well as the fluticasone daily. He has done well with this. He sometimes is more stuffy in the mporning.  He is open to allergy shots.  [place]  He gets off at 3:30 PM.  Otherwise, there have been no changes to his past medical history, surgical history, family history, or social history.    Review of Systems  Constitutional: Negative.  Negative for chills, fever, malaise/fatigue and weight loss.  HENT: Negative.  Negative for congestion, ear discharge and ear pain.   Eyes: Negative for pain, discharge and redness.  Respiratory: Negative for cough, sputum production, shortness of breath and wheezing.   Cardiovascular: Negative.  Negative for chest pain and palpitations.  Gastrointestinal: Negative for abdominal pain, constipation, diarrhea, heartburn, nausea and vomiting.  Skin: Negative.  Negative for itching and rash.  Neurological: Negative for dizziness and headaches.  Endo/Heme/Allergies: Negative for environmental allergies. Does not bruise/bleed easily.  Objective:   Blood pressure 136/70, pulse 60, temperature 98.1 F (36.7 C), temperature source Temporal, resp. rate 18, SpO2 98 %. There is no height or weight on file to calculate BMI.   Physical Exam:  Physical Exam  Constitutional: He appears well-developed.  Pleasant male.  HENT:  Head: Normocephalic and atraumatic.  Right Ear: Tympanic membrane, external ear and ear canal normal.  Left Ear: Tympanic membrane, external ear and ear canal normal.   Nose: Mucosal edema and rhinorrhea present. No nasal deformity or septal deviation. No epistaxis. Right sinus exhibits no maxillary sinus tenderness and no frontal sinus tenderness. Left sinus exhibits no maxillary sinus tenderness and no frontal sinus tenderness.  Mouth/Throat: Uvula is midline and oropharynx is clear and moist. Mucous membranes are not pale and not dry.  Cobblestoning present in the posterior oropharynx.  Eyes: Pupils are equal, round, and reactive to light. Conjunctivae and EOM are normal. Right eye exhibits no chemosis and no discharge. Left eye exhibits no chemosis and no discharge. Right conjunctiva is not injected. Left conjunctiva is not injected.  Cardiovascular: Normal rate, regular rhythm and normal heart sounds.  Respiratory: Effort normal and breath sounds normal. No accessory muscle usage. No tachypnea. No respiratory distress. He has no wheezes. He has no rhonchi. He has no rales. He exhibits no tenderness.  Moving air well in all lung fields.  Lymphadenopathy:    He has no cervical adenopathy.  Neurological: He is alert.  Skin: No abrasion, no petechiae and no rash noted. Rash is not papular, not vesicular and not urticarial. No erythema. No pallor.  Psychiatric: He has a normal mood and affect.     Diagnostic studies: none    Salvatore Marvel, MD  [facility]

## 2019-07-10 ENCOUNTER — Encounter: Payer: Self-pay | Admitting: Allergy & Immunology

## 2019-07-13 ENCOUNTER — Other Ambulatory Visit: Payer: Self-pay | Admitting: Allergy & Immunology

## 2019-09-08 DIAGNOSIS — Z23 Encounter for immunization: Secondary | ICD-10-CM | POA: Diagnosis not present

## 2019-10-13 DIAGNOSIS — Z23 Encounter for immunization: Secondary | ICD-10-CM | POA: Diagnosis not present

## 2020-12-21 ENCOUNTER — Inpatient Hospital Stay (HOSPITAL_COMMUNITY)
Admission: EM | Admit: 2020-12-21 | Discharge: 2020-12-24 | DRG: 439 | Disposition: A | Discharge status: Home / Self Care | Payer: BLUE CROSS/BLUE SHIELD | Attending: Student | Admitting: Student

## 2020-12-21 ENCOUNTER — Observation Stay (HOSPITAL_COMMUNITY): Payer: BLUE CROSS/BLUE SHIELD

## 2020-12-21 ENCOUNTER — Encounter (HOSPITAL_COMMUNITY): Payer: Self-pay

## 2020-12-21 ENCOUNTER — Other Ambulatory Visit: Payer: Self-pay

## 2020-12-21 DIAGNOSIS — E876 Hypokalemia: Secondary | ICD-10-CM | POA: Diagnosis present

## 2020-12-21 DIAGNOSIS — K852 Alcohol induced acute pancreatitis without necrosis or infection: Principal | ICD-10-CM | POA: Diagnosis present

## 2020-12-21 DIAGNOSIS — F1010 Alcohol abuse, uncomplicated: Secondary | ICD-10-CM | POA: Diagnosis present

## 2020-12-21 DIAGNOSIS — Z79899 Other long term (current) drug therapy: Secondary | ICD-10-CM

## 2020-12-21 DIAGNOSIS — Z20822 Contact with and (suspected) exposure to covid-19: Secondary | ICD-10-CM | POA: Diagnosis present

## 2020-12-21 DIAGNOSIS — K859 Acute pancreatitis without necrosis or infection, unspecified: Secondary | ICD-10-CM | POA: Diagnosis not present

## 2020-12-21 DIAGNOSIS — E871 Hypo-osmolality and hyponatremia: Secondary | ICD-10-CM | POA: Diagnosis present

## 2020-12-21 LAB — CBC WITH DIFFERENTIAL/PLATELET
Abs Immature Granulocytes: 0.07 10*3/uL (ref 0.00–0.07)
Basophils Absolute: 0 10*3/uL (ref 0.0–0.1)
Basophils Relative: 0 %
Eosinophils Absolute: 0 10*3/uL (ref 0.0–0.5)
Eosinophils Relative: 0 %
HCT: 50.5 % (ref 39.0–52.0)
Hemoglobin: 15.5 g/dL (ref 13.0–17.0)
Immature Granulocytes: 0 %
Lymphocytes Relative: 4 %
Lymphs Abs: 0.7 10*3/uL (ref 0.7–4.0)
MCH: 24.2 pg — ABNORMAL LOW (ref 26.0–34.0)
MCHC: 30.7 g/dL (ref 30.0–36.0)
MCV: 78.9 fL — ABNORMAL LOW (ref 80.0–100.0)
Monocytes Absolute: 0.9 10*3/uL (ref 0.1–1.0)
Monocytes Relative: 5 %
Neutro Abs: 16.7 10*3/uL — ABNORMAL HIGH (ref 1.7–7.7)
Neutrophils Relative %: 91 %
Platelets: 392 10*3/uL (ref 150–400)
RBC: 6.4 MIL/uL — ABNORMAL HIGH (ref 4.22–5.81)
RDW: 15.5 % (ref 11.5–15.5)
WBC: 18.4 10*3/uL — ABNORMAL HIGH (ref 4.0–10.5)
nRBC: 0 % (ref 0.0–0.2)

## 2020-12-21 LAB — RESP PANEL BY RT-PCR (FLU A&B, COVID) ARPGX2
Influenza A by PCR: NEGATIVE
Influenza B by PCR: NEGATIVE
SARS Coronavirus 2 by RT PCR: NEGATIVE

## 2020-12-21 LAB — COMPREHENSIVE METABOLIC PANEL
ALT: 29 U/L (ref 0–44)
AST: 32 U/L (ref 15–41)
Albumin: 4.7 g/dL (ref 3.5–5.0)
Alkaline Phosphatase: 54 U/L (ref 38–126)
Anion gap: 12 (ref 5–15)
BUN: 9 mg/dL (ref 6–20)
CO2: 22 mmol/L (ref 22–32)
Calcium: 9.5 mg/dL (ref 8.9–10.3)
Chloride: 102 mmol/L (ref 98–111)
Creatinine, Ser: 0.99 mg/dL (ref 0.61–1.24)
GFR, Estimated: >60 mL/min (ref >60)
Glucose, Bld: 123 mg/dL — ABNORMAL HIGH (ref 70–99)
Potassium: 4 mmol/L (ref 3.5–5.1)
Sodium: 136 mmol/L (ref 135–145)
Total Bilirubin: 1.2 mg/dL (ref 0.3–1.2)
Total Protein: 8.1 g/dL (ref 6.5–8.1)

## 2020-12-21 LAB — ETHANOL: Alcohol, Ethyl (B): <10 mg/dL (ref <10)

## 2020-12-21 LAB — LIPASE, BLOOD: Lipase: 387 U/L — ABNORMAL HIGH (ref 11–51)

## 2020-12-21 LAB — LACTATE DEHYDROGENASE: LDH: 148 U/L (ref 98–192)

## 2020-12-21 MED ORDER — THIAMINE HCL 100 MG PO TABS
100.0000 mg | ORAL_TABLET | Freq: Every day | ORAL | Status: DC
  Administered 2020-12-22 – 2020-12-24 (×3): 100 mg via ORAL
  Filled 2020-12-21 (×3): qty 1

## 2020-12-21 MED ORDER — ONDANSETRON HCL 4 MG/2ML IJ SOLN
4.0000 mg | Freq: Once | INTRAMUSCULAR | Status: AC
  Administered 2020-12-21: 4 mg via INTRAVENOUS
  Filled 2020-12-21: qty 2

## 2020-12-21 MED ORDER — HEPARIN SODIUM (PORCINE) 5000 UNIT/ML IJ SOLN
5000.0000 [IU] | Freq: Three times a day (TID) | INTRAMUSCULAR | Status: DC
  Administered 2020-12-22: 5000 [IU] via SUBCUTANEOUS
  Filled 2020-12-21: qty 1

## 2020-12-21 MED ORDER — MORPHINE SULFATE (PF) 4 MG/ML IV SOLN
4.0000 mg | INTRAVENOUS | Status: DC | PRN
  Administered 2020-12-22 – 2020-12-23 (×4): 4 mg via INTRAVENOUS
  Filled 2020-12-21 (×4): qty 1

## 2020-12-21 MED ORDER — MONTELUKAST SODIUM 10 MG PO TABS: 10.0000 mg | ORAL_TABLET | Freq: Every day | ORAL | Status: DC

## 2020-12-21 MED ORDER — HYDROMORPHONE HCL 1 MG/ML IJ SOLN
0.5000 mg | Freq: Once | INTRAMUSCULAR | Status: AC
  Administered 2020-12-21: 0.5 mg via INTRAVENOUS
  Filled 2020-12-21: qty 1

## 2020-12-21 MED ORDER — SODIUM CHLORIDE 0.9 % IV SOLN: INTRAVENOUS | Status: DC

## 2020-12-21 MED ORDER — ADULT MULTIVITAMIN W/MINERALS CH
1.0000 | ORAL_TABLET | Freq: Every day | ORAL | Status: DC
  Administered 2020-12-22 – 2020-12-24 (×3): 1 via ORAL
  Filled 2020-12-21 (×3): qty 1

## 2020-12-21 MED ORDER — OXYCODONE HCL 5 MG PO TABS
5.0000 mg | ORAL_TABLET | ORAL | Status: DC | PRN
  Administered 2020-12-22 – 2020-12-24 (×6): 5 mg via ORAL
  Filled 2020-12-21 (×7): qty 1

## 2020-12-21 MED ORDER — ONDANSETRON HCL 4 MG PO TABS
4.0000 mg | ORAL_TABLET | Freq: Four times a day (QID) | ORAL | Status: DC | PRN
  Administered 2020-12-24: 4 mg via ORAL
  Filled 2020-12-21: qty 1

## 2020-12-21 MED ORDER — THIAMINE HCL 100 MG/ML IJ SOLN: 100.0000 mg | Freq: Every day | INTRAMUSCULAR | Status: DC

## 2020-12-21 MED ORDER — HYDROMORPHONE HCL 1 MG/ML IJ SOLN
0.5000 mg | Freq: Once | INTRAMUSCULAR | Status: AC
Start: 2020-12-21 — End: 2020-12-21
  Administered 2020-12-21: 0.5 mg via INTRAVENOUS
  Filled 2020-12-21: qty 1

## 2020-12-21 MED ORDER — FLUTICASONE PROPIONATE 50 MCG/ACT NA SUSP: 2.0000 | Freq: Two times a day (BID) | NASAL | Status: DC

## 2020-12-21 MED ORDER — ONDANSETRON HCL 4 MG/2ML IJ SOLN
4.0000 mg | Freq: Four times a day (QID) | INTRAMUSCULAR | Status: DC | PRN
  Administered 2020-12-23: 4 mg via INTRAVENOUS
  Filled 2020-12-21 (×2): qty 2

## 2020-12-21 MED ORDER — LORAZEPAM 1 MG PO TABS
1.0000 mg | ORAL_TABLET | ORAL | Status: DC | PRN
  Administered 2020-12-24: 1 mg via ORAL

## 2020-12-21 MED ORDER — ACETAMINOPHEN 325 MG PO TABS: 650.0000 mg | ORAL_TABLET | Freq: Four times a day (QID) | ORAL | Status: DC | PRN

## 2020-12-21 MED ORDER — TRAZODONE HCL 50 MG PO TABS
50.0000 mg | ORAL_TABLET | Freq: Every evening | ORAL | Status: DC | PRN
  Administered 2020-12-22: 50 mg via ORAL
  Filled 2020-12-21: qty 1

## 2020-12-21 MED ORDER — LORAZEPAM 2 MG/ML IJ SOLN: 1.0000 mg | INTRAMUSCULAR | Status: DC | PRN

## 2020-12-21 MED ORDER — FOLIC ACID 1 MG PO TABS
1.0000 mg | ORAL_TABLET | Freq: Every day | ORAL | Status: DC
  Administered 2020-12-22 – 2020-12-24 (×3): 1 mg via ORAL
  Filled 2020-12-21 (×3): qty 1

## 2020-12-21 MED ORDER — LORAZEPAM 2 MG/ML IJ SOLN
0.0000 mg | Freq: Two times a day (BID) | INTRAMUSCULAR | Status: DC
Start: 2020-12-24 — End: 2020-12-22

## 2020-12-21 MED ORDER — ACETAMINOPHEN 650 MG RE SUPP: 650.0000 mg | Freq: Four times a day (QID) | RECTAL | Status: DC | PRN

## 2020-12-21 MED ORDER — LORAZEPAM 2 MG/ML IJ SOLN
0.0000 mg | Freq: Four times a day (QID) | INTRAMUSCULAR | Status: DC
Start: 2020-12-21 — End: 2020-12-22

## 2020-12-21 NOTE — ED Provider Notes (Signed)
Emergency Medicine Provider Triage Evaluation Note  Angel Prince , a 35 y.o. male  was evaluated in triage.  Pt complains of acute pancreatitis.  Patient was seen at [facility] earlier today.  He had a work-up for abdominal pain and vomiting demonstrating acute pancreatitis.  This included elevated lipase in the 800s and a CT scan consistent with pancreatitis without complications.  Patient states that his pain was not adequately controlled there prompting him to leave AMA.  He is a daily alcohol drinker, vodka.  He denies having pancreatitis in the past.  He reports 2 to 3 days of pain and vomiting.  Pain radiates to the left mid back.  Review of Systems  Positive: Abdominal pain, vomiting Negative: Fever  Physical Exam  BP (!) 159/89 (BP Location: Left Arm)   Pulse (!) 56   Temp 98.2 F (36.8 C) (Oral)   Resp 16   SpO2 94%  Gen:   Awake, no distress   Resp:  Normal effort  MSK:   Moves extremities without difficulty  Other:  Abdomen epigastric tenderness  Medical Decision Making  Medically screening exam initiated at 5:44 PM.  Appropriate orders placed.  Tsuneo Faison was informed that the remainder of the evaluation will be completed by another provider, this initial triage assessment does not replace that evaluation, and the importance of remaining in the ED until their evaluation is complete.  CT impression from earlier today:  Moderate acute pancreatitis and possibly secondary associated gastroduodenitis. No other complicating feature by CT.    Renne Crigler, PA-C 12/21/20 1746    Linwood Dibbles, MD 12/21/20 [phone]

## 2020-12-21 NOTE — ED Provider Notes (Signed)
[facility]-EMERGENCY DEPT Provider Note   CSN: [id number] Arrival date & time: 12/21/20  1701     History Chief Complaint  Patient presents with   Abdominal Pain   Vomiting    Angel Prince is a 35 y.o. male with no past pertinent medical history.  Patient presents emergency department with a chief complaint of epigastric abdominal pain.  Patient reports that pain started on Thursday and has been constant since then.  Patient describes pain as a "cramping and burning sensation."  Patient rates pain 9/10 on the pain scale.  Pain wraps around to bilateral flanks and back.  Pain is worse in certain positions.  Patient has not tried any modalities at home to alleviate his symptoms.  Patient endorses nausea and vomiting.  States that he has vomited approximately 10 times in the last 24 hours.  Patient describes emesis as clear or green in color.  Patient denies any hematemesis or coffee-ground emesis.  Patient reports that he was at [facility] earlier today and was diagnosed with acute pancreatitis however left AMA due to pain not adequately being controlled.  Patient denies any previous history of acute pancreatitis.  Patient endorses daily alcohol use.  Patient states that he drinks 1 pint of liquor daily.  Patient states that he has not had any alcohol since Thursday.  Patient denies any previous history of admission for alcohol withdrawal.  Patient denies any tremors, seizures, tactile hallucinations.  Patient endorses chills and decreased appetite.  Denies any abdominal distention, blood in stool, melena, diarrhea, constipation, dysuria, hematuria, urinary frequency.       Abdominal Pain Associated symptoms: nausea and vomiting   Associated symptoms: no chest pain, no chills, no constipation, no diarrhea, no dysuria, no fever, no hematuria and no shortness of breath       History reviewed. No pertinent past medical history.  Patient Active Problem List    Diagnosis Date Noted   Seasonal and perennial allergic rhinitis 03/26/2018   Rhinitis medicamentosa 03/26/2018    History reviewed. No pertinent surgical history.     History reviewed. No pertinent family history.  Social History   Tobacco Use   Smoking status: Never   Smokeless tobacco: Never  Vaping Use   Vaping Use: Every day  Substance Use Topics   Alcohol use: Yes    Comment: occasional   Drug use: Not Currently    Home Medications Prior to Admission medications   Medication Sig Start Date End Date Taking? Authorizing Provider  azelastine (ASTELIN) 0.1 % nasal spray Place 2 sprays into both nostrils 2 (two) times daily. 07/07/19   Alfonse Spruce, MD  fluticasone [facility]) 50 MCG/ACT nasal spray Place 2 sprays into both nostrils 2 (two) times daily. 07/07/19   Alfonse Spruce, MD  montelukast (SINGULAIR) 10 MG tablet TAKE 1 TABLET BY MOUTH EVERYDAY AT BEDTIME 07/13/19   Alfonse Spruce, MD  oxymetazoline (AFRIN) 0.05 % nasal spray Place 2 sprays into both nostrils 2 (two) times daily.    [provider]    Allergies    Patient has no known allergies.  Review of Systems   Review of Systems  Constitutional:  Negative for chills and fever.  Eyes:  Negative for visual disturbance.  Respiratory:  Negative for shortness of breath.   Cardiovascular:  Negative for chest pain.  Gastrointestinal:  Positive for abdominal pain, nausea and vomiting. Negative for abdominal distention, anal bleeding, blood in stool, constipation and diarrhea.  Genitourinary:  Negative  for difficulty urinating, dysuria, frequency, genital sores, hematuria, penile discharge, penile pain, penile swelling, scrotal swelling and testicular pain.  Musculoskeletal:  Negative for back pain and neck pain.  Skin:  Negative for color change and rash.  Neurological:  Negative for dizziness, tremors, seizures, syncope, light-headedness and headaches.  Psychiatric/Behavioral:  Negative  for confusion and hallucinations.    Physical Exam Updated Vital Signs BP (!) 159/89 (BP Location: Left Arm)   Pulse (!) 56   Temp 98.2 F (36.8 C) (Oral)   Resp 16   SpO2 94%   Physical Exam Vitals and nursing note reviewed.  Constitutional:      General: He is not in acute distress.    Appearance: He is not ill-appearing, toxic-appearing or diaphoretic.  HENT:     Head: Normocephalic.  Eyes:     General: No scleral icterus.       Right eye: No discharge.        Left eye: No discharge.  Cardiovascular:     Rate and Rhythm: Normal rate.  Pulmonary:     Effort: Pulmonary effort is normal. No tachypnea, bradypnea or respiratory distress.     Breath sounds: Normal breath sounds. No stridor.  Abdominal:     General: Abdomen is protuberant. Bowel sounds are normal. There is no distension. There are no signs of injury.     Palpations: Abdomen is soft. There is no mass or pulsatile mass.     Tenderness: There is abdominal tenderness in the epigastric area. There is no right CVA tenderness, left CVA tenderness, guarding or rebound.     Hernia: There is no hernia in the umbilical area or ventral area.  Musculoskeletal:     Right lower leg: Normal.     Left lower leg: Normal.  Skin:    General: Skin is warm and dry.  Neurological:     General: No focal deficit present.     Mental Status: He is alert.  Psychiatric:        Behavior: Behavior is cooperative.    ED Results / Procedures / Treatments   Labs (all labs ordered are listed, but only abnormal results are displayed) Labs Reviewed  CBC WITH DIFFERENTIAL/PLATELET - Abnormal; Notable for the following components:      Result Value   WBC 18.4 (*)    RBC 6.40 (*)    MCV 78.9 (*)    MCH 24.2 (*)    Neutro Abs 16.7 (*)    All other components within normal limits  LIPASE, BLOOD - Abnormal; Notable for the following components:   Lipase 387 (*)    All other components within normal limits  COMPREHENSIVE METABOLIC PANEL -  Abnormal; Notable for the following components:   Glucose, Bld 123 (*)    All other components within normal limits  RESP PANEL BY RT-PCR (FLU A&B, COVID) ARPGX2  ETHANOL  LACTATE DEHYDROGENASE  COMPREHENSIVE METABOLIC PANEL  CBC  LIPASE, BLOOD  HIV ANTIBODY (ROUTINE TESTING W REFLEX)    EKG None  Radiology No results found.  Procedures Procedures   Medications Ordered in ED Medications  0.9 %  sodium chloride infusion ( Intravenous New Bag/Given 12/21/20 2037)  HYDROmorphone (DILAUDID) injection 0.5 mg (0.5 mg Intravenous Given 12/21/20 2038)  ondansetron (ZOFRAN) injection 4 mg (4 mg Intravenous Given 12/21/20 2038)    ED Course  I have reviewed the triage vital signs and the nursing notes.  Pertinent labs & imaging results that were available during my care of the  patient were reviewed by me and considered in my medical decision making (see chart for details).    MDM Rules/Calculators/A&P                           Alert 35 year old male no acute distress, nontoxic appearing.  Presents to ED with chief complaint of epigastric pain.  Per chart review patient was seen at [facility] earlier today.  Patient was diagnosed with acute pancreatitis with lipase elevated in the 800s.  Patient left AMA due to reports of pain not being adequately controlled.  CT scan obtained earlier today shows moderate acute pancreatitis and possibly secondary associated  gastroduodenitis.  On exam abdomen soft, nondistended, epigastric tenderness, no guarding or rebound tenderness.  Patient given 1 L fluid bolus, Dilaudid and Zofran.  Abdominal lab work obtained.  Suspect that patient's pancreatitis is secondary to daily alcohol use.    CBC shows leukocytosis at 18.4 CMP unremarkable Lipase 387  Patient continues to complain of epigastric abdominal pain.  Patient given second round of Dilaudid pain medication.  Due to patient's acute pancreatitis continue pain will consult hospitalist for  continued pain management.  2219 spoke to hospitalist Dr. Carren Rang who agreed to admit the patient for admission.  Final Clinical Impression(s) / ED Diagnoses Final diagnoses:  Pancreatitis    Rx / DC Orders ED Discharge Orders     None        Haskel Schroeder, PA-C 12/22/20 0221    Tegeler, Canary Brim, MD 12/23/20 0021

## 2020-12-21 NOTE — H&P (Signed)
TRH H&P    Patient Demographics:    Angel Prince, is a 35 y.o. male  MRN: [id number]  DOB - [birth year]  Admit Date - 12/21/2020  Referring MD/NP/PA: Theron Arista  Outpatient Primary MD for the patient is [name], MD  Patient coming from: Home  Chief complaint-  abdominal pain    HPI:    Angel Prince  is a 35 y.o. male, with no known medical history aside from alcohol abuse presents to the ED with a chief complaint of abdominal pain. Half days ago.  It is left upper abdomen and it radiates around to his back.  The pain was sudden in onset when he was doing his normal routine.  He was in his normal state of health until the pain started.  He has been having episodes of nausea and vomiting all day since it started.  He notes that the emesis is clear and yellow.  His last normal meal was 2 days ago.  His last normal bowel movement was 1 day ago.  Laying on his left side makes the pain worse, laying on his right side makes the pain better.  He has not tried any Tylenol or ibuprofen at home.  He has had subjective fever, but no measured fever.  He has never had a gallbladder or pancreas problem in the past.  Patient does drink 1/5 of liquor per day.  He is not on any new medications.  He has not recently taken any trips to the [facility].  He denies any trauma to his abdomen.  Patient has no other complaints at this time. Of note patient did go to [facility] earlier today.  He left AMA because his pain was not being controlled adequately-per his report.  Patient does not smoke, he does drink alcohol, he does not use illicit drugs, he is vaccinated for COVID.  Patient is full code.  In the ED Temp 98.2, heart rate 47-56, respiratory rate 18, blood pressure 151/80, satting 100% Leukocytosis 18.4, hemoglobin 15.5, platelets 392 Chemistry panel is unremarkable Lipase 387, calcium 9.5 Respiratory panel  pending Dilaudid 0.5 mg x 2 in the ED Zofran 4 mg Normal saline 125 mill per hour    Review of systems:    In addition to the HPI above,  Subjective fever No Headache, No changes with Vision or hearing, No problems swallowing food or Liquids, No Chest pain, Cough or Shortness of Breath, Admits to abdominal pain, nausea, vomiting, bowel movements are regular, No Blood in stool or Urine, No dysuria, No new skin rashes or bruises, No new joints pains-aches,  No new weakness, tingling, numbness in any extremity, No recent weight gain or loss, No polyuria, polydypsia or polyphagia, No significant Mental Stressors.  All other systems reviewed and are negative.    Past History of the following :    History reviewed. No pertinent past medical history.    History reviewed. No pertinent surgical history.    Social History:      Social History   Tobacco Use   Smoking  status: Never   Smokeless tobacco: Never  Substance Use Topics   Alcohol use: Yes    Comment: occasional       Family History :    History reviewed. No pertinent family history. Family history hypertension   Home Medications:   Prior to Admission medications   Medication Sig Start Date End Date Taking? Authorizing Provider  azelastine (ASTELIN) 0.1 % nasal spray Place 2 sprays into both nostrils 2 (two) times daily. Patient not taking: Reported on 12/21/2020 07/07/19   Alfonse Spruce, MD  fluticasone [facility]) 50 MCG/ACT nasal spray Place 2 sprays into both nostrils 2 (two) times daily. Patient not taking: Reported on 12/21/2020 07/07/19   Alfonse Spruce, MD  montelukast (SINGULAIR) 10 MG tablet TAKE 1 TABLET BY MOUTH EVERYDAY AT BEDTIME Patient not taking: Reported on 12/21/2020 07/13/19   Alfonse Spruce, MD     Allergies:    Not on File   Physical Exam:   Vitals  Blood pressure (!) 171/87, pulse (!) 52, temperature 99.1 F (37.3 C), temperature source Oral, resp. rate 18,  height 5' 6.75" (1.695 m), weight 92.1 kg, SpO2 97 %.  1.  General: Patient lying supine in bed,  no acute distress   2. Psychiatric: Alert and oriented x 3, mood and behavior normal for situation, pleasant and cooperative with exam   3. Neurologic: Speech and language are normal, face is symmetric, moves all 4 extremities voluntarily, at baseline without acute deficits on limited exam   4. HEENMT:  Head is atraumatic, normocephalic, pupils reactive to light, neck is supple, trachea is midline, mucous membranes are moist   5. Respiratory : Lungs are clear to auscultation bilaterally without wheezing, rhonchi, rales, no cyanosis, no increase in work of breathing or accessory muscle use   6. Cardiovascular : Heart rate normal, rhythm is regular, no murmurs, rubs or gallops, no peripheral edema, peripheral pulses palpated   7. Gastrointestinal:  Abdomen is soft, nondistended,  minimally tender in the left upper quadrant, bowel sounds active, no masses or organomegaly palpated   8. Skin:  Skin is warm, dry and intact without rashes, acute lesions, or ulcers on limited exam   9.Musculoskeletal:  No acute deformities or trauma, no asymmetry in tone, no peripheral edema, peripheral pulses palpated, no tenderness to palpation in the extremities     Data Review:    CBC Recent Labs  Lab 12/21/20 1808  WBC 18.4*  HGB 15.5  HCT 50.5  PLT 392  MCV 78.9*  MCH 24.2*  MCHC 30.7  RDW 15.5  LYMPHSABS 0.7  MONOABS 0.9  EOSABS 0.0  BASOSABS 0.0   ------------------------------------------------------------------------------------------------------------------  Results for orders placed or performed during the hospital encounter of 12/21/20 (from the past 48 hour(s))  CBC with Differential     Status: Abnormal   Collection Time: 12/21/20  6:08 PM  Result Value Ref Range   WBC 18.4 (H) 4.0 - 10.5 K/uL   RBC 6.40 (H) 4.22 - 5.81 MIL/uL   Hemoglobin 15.5 13.0 - 17.0 g/dL   HCT 32.2  02.5 - 42.7 %   MCV 78.9 (L) 80.0 - 100.0 fL   MCH 24.2 (L) 26.0 - 34.0 pg   MCHC 30.7 30.0 - 36.0 g/dL   RDW 06.2 37.6 - 28.3 %   Platelets 392 150 - 400 K/uL   nRBC 0.0 0.0 - 0.2 %   Neutrophils Relative % 91 %   Neutro Abs 16.7 (H) 1.7 - 7.7 K/uL  Lymphocytes Relative 4 %   Lymphs Abs 0.7 0.7 - 4.0 K/uL   Monocytes Relative 5 %   Monocytes Absolute 0.9 0.1 - 1.0 K/uL   Eosinophils Relative 0 %   Eosinophils Absolute 0.0 0.0 - 0.5 K/uL   Basophils Relative 0 %   Basophils Absolute 0.0 0.0 - 0.1 K/uL   Immature Granulocytes 0 %   Abs Immature Granulocytes 0.07 0.00 - 0.07 K/uL    Comment: Performed at [facility], [address] [place], [place] [address]  Lipase, blood     Status: Abnormal   Collection Time: 12/21/20  6:08 PM  Result Value Ref Range   Lipase 387 (H) 11 - 51 U/L    Comment: RESULTS CONFIRMED BY MANUAL DILUTION Performed at [facility], [address], [place], [place] [address] CORRECTED ON 09/03 AT 2147: PREVIOUSLY REPORTED AS 603 RESULTS CONFIRMED BY MANUAL DILUTION   Comprehensive metabolic panel     Status: Abnormal   Collection Time: 12/21/20  6:08 PM  Result Value Ref Range   Sodium 136 135 - 145 mmol/L   Potassium 4.0 3.5 - 5.1 mmol/L   Chloride 102 98 - 111 mmol/L   CO2 22 22 - 32 mmol/L   Glucose, Bld 123 (H) 70 - 99 mg/dL    Comment: Glucose reference range applies only to samples taken after fasting for at least 8 hours.   BUN 9 6 - 20 mg/dL   Creatinine, Ser 2.95 0.61 - 1.24 mg/dL   Calcium 9.5 8.9 - 62.1 mg/dL   Total Protein 8.1 6.5 - 8.1 g/dL   Albumin 4.7 3.5 - 5.0 g/dL   AST 32 15 - 41 U/L   ALT 29 0 - 44 U/L   Alkaline Phosphatase 54 38 - 126 U/L   Total Bilirubin 1.2 0.3 - 1.2 mg/dL   GFR, Estimated >30 >86 mL/min    Comment: (NOTE) Calculated using the CKD-EPI Creatinine Equation (2021)    Anion gap 12 5 - 15    Comment: Performed at [facility], [address]  [place], [place] [address]  Resp Panel by RT-PCR (Flu A&B, Covid) Nasopharyngeal Swab     Status: None   Collection Time: 12/21/20  8:44 PM   Specimen: Nasopharyngeal Swab; Nasopharyngeal(NP) swabs in vial transport medium  Result Value Ref Range   SARS Coronavirus 2 by RT PCR NEGATIVE NEGATIVE    Comment: (NOTE) SARS-CoV-2 target nucleic acids are NOT DETECTED.  The SARS-CoV-2 RNA is generally detectable in upper respiratory specimens during the acute phase of infection. The lowest concentration of SARS-CoV-2 viral copies this assay can detect is 138 copies/mL. A negative result does not preclude SARS-Cov-2 infection and should not be used as the sole basis for treatment or other patient management decisions. A negative result may occur with  improper specimen collection/handling, submission of specimen other than nasopharyngeal swab, presence of viral mutation(s) within the areas targeted by this assay, and inadequate number of viral copies(<138 copies/mL). A negative result must be combined with clinical observations, patient history, and epidemiological information. The expected result is Negative.  Fact Sheet for Patients:  BloggerCourse.com  Fact Sheet for Healthcare Providers:  SeriousBroker.it  This test is no t yet approved or cleared by the [place] FDA and  has been authorized for detection and/or diagnosis of SARS-CoV-2 by FDA under an Emergency Use Authorization (EUA). This EUA will remain  in effect (meaning this test can be used) for the  duration of the COVID-19 declaration under Section 564(b)(1) of the Act, 21 U.S.C.section 360bbb-3(b)(1), unless the authorization is terminated  or revoked sooner.       Influenza A by PCR NEGATIVE NEGATIVE   Influenza B by PCR NEGATIVE NEGATIVE    Comment: (NOTE) The Xpert Xpress SARS-CoV-2/FLU/RSV plus assay is intended as an aid in the diagnosis of influenza from  Nasopharyngeal swab specimens and should not be used as a sole basis for treatment. Nasal washings and aspirates are unacceptable for Xpert Xpress SARS-CoV-2/FLU/RSV testing.  Fact Sheet for Patients: BloggerCourse.com  Fact Sheet for Healthcare Providers: SeriousBroker.it  This test is not yet approved or cleared by the [place] FDA and has been authorized for detection and/or diagnosis of SARS-CoV-2 by FDA under an Emergency Use Authorization (EUA). This EUA will remain in effect (meaning this test can be used) for the duration of the COVID-19 declaration under Section 564(b)(1) of the Act, 21 U.S.C. section 360bbb-3(b)(1), unless the authorization is terminated or revoked.  Performed at [facility], [address] [place], [place] [address]   Ethanol     Status: None   Collection Time: 12/21/20 10:24 PM  Result Value Ref Range   Alcohol, Ethyl (B) <10 <10 mg/dL    Comment: (NOTE) Lowest detectable limit for serum alcohol is 10 mg/dL.  For medical purposes only. Performed at [facility], [address], [place], [place] [address]   Lactate dehydrogenase     Status: None   Collection Time: 12/21/20 10:24 PM  Result Value Ref Range   LDH 148 98 - 192 U/L    Comment: Performed at [facility], [address], [place], [place] [address]    Chemistries  Recent Labs  Lab 12/21/20 1808  NA 136  K 4.0  CL 102  CO2 22  GLUCOSE 123*  BUN 9  CREATININE 0.99  CALCIUM 9.5  AST 32  ALT 29  ALKPHOS 54  BILITOT 1.2   ------------------------------------------------------------------------------------------------------------------  ------------------------------------------------------------------------------------------------------------------ GFR: Estimated Creatinine Clearance: 114.2 mL/min (by C-G formula based on SCr of 0.99 mg/dL). Liver Function  Tests: Recent Labs  Lab 12/21/20 1808  AST 32  ALT 29  ALKPHOS 54  BILITOT 1.2  PROT 8.1  ALBUMIN 4.7   Recent Labs  Lab 12/21/20 1808  LIPASE 387*   No results for input(s): AMMONIA in the last 168 hours. Coagulation Profile: No results for input(s): INR, PROTIME in the last 168 hours. Cardiac Enzymes: No results for input(s): CKTOTAL, CKMB, CKMBINDEX, TROPONINI in the last 168 hours. BNP (last 3 results) No results for input(s): PROBNP in the last 8760 hours. HbA1C: No results for input(s): HGBA1C in the last 72 hours. CBG: No results for input(s): GLUCAP in the last 168 hours. Lipid Profile: No results for input(s): CHOL, HDL, LDLCALC, TRIG, CHOLHDL, LDLDIRECT in the last 72 hours. Thyroid Function Tests: No results for input(s): TSH, T4TOTAL, FREET4, T3FREE, THYROIDAB in the last 72 hours. Anemia Panel: No results for input(s): VITAMINB12, FOLATE, FERRITIN, TIBC, IRON, RETICCTPCT in the last 72 hours.  --------------------------------------------------------------------------------------------------------------- Urine analysis: No results found for: COLORURINE, APPEARANCEUR, LABSPEC, PHURINE, GLUCOSEU, HGBUR, BILIRUBINUR, KETONESUR, PROTEINUR, UROBILINOGEN, NITRITE, LEUKOCYTESUR    Imaging Results:    No results found.  CT abdomen from [facility] shows peripancreatic hazy edema/inflammation noted with some fluid tracking along the duodenum, liver hilum, and right retroperitoneal space.  Findings compatible with acute pancreatitis.   Assessment & Plan:    Active Problems:  Pancreatitis   Acute alcoholic pancreatitis As seen on CT as described above, lipase 387, leukocytosis 18.4 Patient drinks a few liquor per day Discussed that that is the cause of the pancreatitis, and it would continue to get worse if he does not correct his drinking Will also get a right upper quadrant ultrasound and a.m. for completeness Pain control with Tylenol, oxycodone and  morphine N.p.o. Maintenance fluids Continue to monitor Trend lipase Alcohol abuse CIWA protocol Counseled on cessation     DVT Prophylaxis-Heparin- SCDs   AM Labs Ordered, also please review Full Orders  Family Communication: Admission, patients condition and plan of care including tests being ordered have been discussed with the patient and fianc who indicate understanding and agree with the plan and Code Status.  Code Status: Full  Admission status: Observation  Time spent in minutes : 65   Mehak Roskelley B Zierle-Ghosh DO

## 2020-12-21 NOTE — ED Triage Notes (Signed)
Pt c/o abdominal pain and vomiting x 2 days. Pt was seen at [facility] today and diagnosed with pancreatitis. Pt arrives with imaging from OSH.

## 2020-12-22 DIAGNOSIS — E871 Hypo-osmolality and hyponatremia: Secondary | ICD-10-CM | POA: Diagnosis present

## 2020-12-22 DIAGNOSIS — Z20822 Contact with and (suspected) exposure to covid-19: Secondary | ICD-10-CM | POA: Diagnosis present

## 2020-12-22 DIAGNOSIS — K852 Alcohol induced acute pancreatitis without necrosis or infection: Secondary | ICD-10-CM | POA: Diagnosis present

## 2020-12-22 DIAGNOSIS — Z79899 Other long term (current) drug therapy: Secondary | ICD-10-CM | POA: Diagnosis not present

## 2020-12-22 DIAGNOSIS — D72825 Bandemia: Secondary | ICD-10-CM | POA: Diagnosis not present

## 2020-12-22 DIAGNOSIS — F1010 Alcohol abuse, uncomplicated: Secondary | ICD-10-CM | POA: Diagnosis present

## 2020-12-22 DIAGNOSIS — E876 Hypokalemia: Secondary | ICD-10-CM | POA: Diagnosis present

## 2020-12-22 DIAGNOSIS — K859 Acute pancreatitis without necrosis or infection, unspecified: Secondary | ICD-10-CM | POA: Diagnosis present

## 2020-12-22 LAB — CBC
HCT: 47.8 % (ref 39.0–52.0)
Hemoglobin: 15.1 g/dL (ref 13.0–17.0)
MCH: 23.9 pg — ABNORMAL LOW (ref 26.0–34.0)
MCHC: 31.6 g/dL (ref 30.0–36.0)
MCV: 75.6 fL — ABNORMAL LOW (ref 80.0–100.0)
Platelets: 397 10*3/uL (ref 150–400)
RBC: 6.32 MIL/uL — ABNORMAL HIGH (ref 4.22–5.81)
RDW: 14.4 % (ref 11.5–15.5)
WBC: 16.7 10*3/uL — ABNORMAL HIGH (ref 4.0–10.5)
nRBC: 0 % (ref 0.0–0.2)

## 2020-12-22 LAB — COMPREHENSIVE METABOLIC PANEL
ALT: 21 U/L (ref 0–44)
AST: 25 U/L (ref 15–41)
Albumin: 4.2 g/dL (ref 3.5–5.0)
Alkaline Phosphatase: 47 U/L (ref 38–126)
Anion gap: 9 (ref 5–15)
BUN: 9 mg/dL (ref 6–20)
CO2: 24 mmol/L (ref 22–32)
Calcium: 9.2 mg/dL (ref 8.9–10.3)
Chloride: 102 mmol/L (ref 98–111)
Creatinine, Ser: 0.97 mg/dL (ref 0.61–1.24)
GFR, Estimated: >60 mL/min (ref >60)
Glucose, Bld: 113 mg/dL — ABNORMAL HIGH (ref 70–99)
Potassium: 4.1 mmol/L (ref 3.5–5.1)
Sodium: 135 mmol/L (ref 135–145)
Total Bilirubin: 1.3 mg/dL — ABNORMAL HIGH (ref 0.3–1.2)
Total Protein: 7.3 g/dL (ref 6.5–8.1)

## 2020-12-22 LAB — LIPASE, BLOOD: Lipase: 833 U/L — ABNORMAL HIGH (ref 11–51)

## 2020-12-22 LAB — HIV ANTIBODY (ROUTINE TESTING W REFLEX): HIV Screen 4th Generation wRfx: NONREACTIVE

## 2020-12-22 MED ORDER — ENOXAPARIN SODIUM 40 MG/0.4ML IJ SOSY
40.0000 mg | PREFILLED_SYRINGE | INTRAMUSCULAR | Status: DC
  Administered 2020-12-22: 40 mg via SUBCUTANEOUS
  Filled 2020-12-22: qty 0.4

## 2020-12-22 MED ORDER — LORAZEPAM 1 MG PO TABS
0.0000 mg | ORAL_TABLET | Freq: Two times a day (BID) | ORAL | Status: DC
  Filled 2020-12-22: qty 1

## 2020-12-22 MED ORDER — LORAZEPAM 1 MG PO TABS: 0.0000 mg | ORAL_TABLET | Freq: Four times a day (QID) | ORAL | Status: AC

## 2020-12-22 NOTE — TOC Initial Note (Signed)
Transition of Care [facility]) - Initial/Assessment Note    Patient Details  Name: Angel Prince MRN: [id number] Date of Birth: 05-21-1985  [facility] CM/SW Contact:    Larrie Kass, LCSW Phone Number: 12/22/2020, 11:05 AM  Clinical Narrative:                 CSW spoke with pt about substance abuse resources , pt stated he did not need any resources that "I know what to do". CSW did attached OP substance abuse resources to d/c paper work. TOC CSW Sign off.    Barriers to Discharge: No Barriers Identified   Patient Goals and CMS Choice        Expected Discharge Plan and Services                                                Prior Living Arrangements/Services                       Activities of Daily Living Home Assistive Devices/Equipment: None ADL Screening (condition at time of admission) Patient's cognitive ability adequate to safely complete daily activities?: Yes Is the patient deaf or have difficulty hearing?: No Does the patient have difficulty seeing, even when wearing glasses/contacts?: No Does the patient have difficulty concentrating, remembering, or making decisions?: No Patient able to express need for assistance with ADLs?: Yes Does the patient have difficulty dressing or bathing?: No Independently performs ADLs?: Yes (appropriate for developmental age) Does the patient have difficulty walking or climbing stairs?: No Weakness of Legs: None Weakness of Arms/Hands: None  Permission Sought/Granted                  Emotional Assessment              Admission diagnosis:  Pancreatitis [K85.90] Patient Active Problem List   Diagnosis Date Noted   Pancreatitis 12/21/2020   Seasonal and perennial allergic rhinitis 03/26/2018   Rhinitis medicamentosa 03/26/2018   PCP:  [name], MD Pharmacy:   [facility] [phone] - [place], [place] - [address] AT [facility] [address] [place] [address] Phone: [phone] Fax: [phone]     Social Determinants of Health (SDOH) Interventions    Readmission Risk Interventions No flowsheet data found.

## 2020-12-22 NOTE — ED Notes (Signed)
ED TO INPATIENT HANDOFF REPORT  Name/Age/Gender Angel Prince 35 y.o. male  Code Status    Code Status Orders  (From admission, onward)         Start     Ordered   12/21/20 2224  Full code  Continuous        12/21/20 2223        Code Status History    This patient has a current code status but no historical code status.      Home/SNF/Other Home  Chief Complaint Pancreatitis [K85.90]  Level of Care/Admitting Diagnosis ED Disposition    ED Disposition  Admit   Condition  --   Comment  Hospital Area: [facility] [[id number]]  Level of Care: Med-Surg [16]  May place patient in observation at [facility] or [facility] if equivalent level of care is available:: Yes  Covid Evaluation: Asymptomatic Screening Protocol (No Symptoms)  Diagnosis: Pancreatitis [595638]  Admitting Physician: Lilyan Gilford [[id number]]  Attending Physician: Lilyan Gilford [[id number]]         Medical History History reviewed. No pertinent past medical history.  Allergies Not on File  IV Location/Drains/Wounds Patient Lines/Drains/Airways Status    Active Line/Drains/Airways    Name Placement date Placement time Site Days   Peripheral IV 12/21/20 20 G Anterior;Distal;Left;Upper Arm 12/21/20  2037  Arm  1          Labs/Imaging Results for orders placed or performed during the hospital encounter of 12/21/20 (from the past 48 hour(s))  CBC with Differential     Status: Abnormal   Collection Time: 12/21/20  6:08 PM  Result Value Ref Range   WBC 18.4 (H) 4.0 - 10.5 K/uL   RBC 6.40 (H) 4.22 - 5.81 MIL/uL   Hemoglobin 15.5 13.0 - 17.0 g/dL   HCT 66.0 63.0 - 16.0 %   MCV 78.9 (L) 80.0 - 100.0 fL   MCH 24.2 (L) 26.0 - 34.0 pg   MCHC 30.7 30.0 - 36.0 g/dL   RDW 10.9 32.3 - 55.7 %   Platelets 392 150 - 400 K/uL   nRBC 0.0 0.0 - 0.2 %   Neutrophils Relative % 91 %   Neutro Abs 16.7 (H) 1.7 - 7.7 K/uL   Lymphocytes Relative 4 %   Lymphs Abs 0.7 0.7 - 4.0  K/uL   Monocytes Relative 5 %   Monocytes Absolute 0.9 0.1 - 1.0 K/uL   Eosinophils Relative 0 %   Eosinophils Absolute 0.0 0.0 - 0.5 K/uL   Basophils Relative 0 %   Basophils Absolute 0.0 0.0 - 0.1 K/uL   Immature Granulocytes 0 %   Abs Immature Granulocytes 0.07 0.00 - 0.07 K/uL    Comment: Performed at [facility], [address], [place], [place] [address]  Lipase, blood     Status: Abnormal   Collection Time: 12/21/20  6:08 PM  Result Value Ref Range   Lipase 387 (H) 11 - 51 U/L    Comment: RESULTS CONFIRMED BY MANUAL DILUTION Performed at [facility], [address] [place], [place] [address] CORRECTED ON 09/03 AT 2147: PREVIOUSLY REPORTED AS 603 RESULTS CONFIRMED BY MANUAL DILUTION   Comprehensive metabolic panel     Status: Abnormal   Collection Time: 12/21/20  6:08 PM  Result Value Ref Range   Sodium 136 135 - 145 mmol/L   Potassium 4.0 3.5 - 5.1 mmol/L   Chloride 102 98 - 111 mmol/L   CO2 22 22 -  32 mmol/L   Glucose, Bld 123 (H) 70 - 99 mg/dL    Comment: Glucose reference range applies only to samples taken after fasting for at least 8 hours.   BUN 9 6 - 20 mg/dL   Creatinine, Ser 0.73 0.61 - 1.24 mg/dL   Calcium 9.5 8.9 - 71.0 mg/dL   Total Protein 8.1 6.5 - 8.1 g/dL   Albumin 4.7 3.5 - 5.0 g/dL   AST 32 15 - 41 U/L   ALT 29 0 - 44 U/L   Alkaline Phosphatase 54 38 - 126 U/L   Total Bilirubin 1.2 0.3 - 1.2 mg/dL   GFR, Estimated >62 >69 mL/min    Comment: (NOTE) Calculated using the CKD-EPI Creatinine Equation (2021)    Anion gap 12 5 - 15    Comment: Performed at [facility] [address] [place], [place] [address]  Resp Panel by RT-PCR (Flu A&B, Covid) Nasopharyngeal Swab     Status: None   Collection Time: 12/21/20  8:44 PM   Specimen: Nasopharyngeal Swab; Nasopharyngeal(NP) swabs in vial transport medium  Result Value Ref Range   SARS Coronavirus 2 by RT PCR NEGATIVE NEGATIVE    Comment:  (NOTE) SARS-CoV-2 target nucleic acids are NOT DETECTED.  The SARS-CoV-2 RNA is generally detectable in upper respiratory specimens during the acute phase of infection. The lowest concentration of SARS-CoV-2 viral copies this assay can detect is 138 copies/mL. A negative result does not preclude SARS-Cov-2 infection and should not be used as the sole basis for treatment or other patient management decisions. A negative result may occur with  improper specimen collection/handling, submission of specimen other than nasopharyngeal swab, presence of viral mutation(s) within the areas targeted by this assay, and inadequate number of viral copies(<138 copies/mL). A negative result must be combined with clinical observations, patient history, and epidemiological information. The expected result is Negative.  Fact Sheet for Patients:  BloggerCourse.com  Fact Sheet for Healthcare Providers:  SeriousBroker.it  This test is no t yet approved or cleared by the [place] FDA and  has been authorized for detection and/or diagnosis of SARS-CoV-2 by FDA under an Emergency Use Authorization (EUA). This EUA will remain  in effect (meaning this test can be used) for the duration of the COVID-19 declaration under Section 564(b)(1) of the Act, 21 U.S.C.section 360bbb-3(b)(1), unless the authorization is terminated  or revoked sooner.       Influenza A by PCR NEGATIVE NEGATIVE   Influenza B by PCR NEGATIVE NEGATIVE    Comment: (NOTE) The Xpert Xpress SARS-CoV-2/FLU/RSV plus assay is intended as an aid in the diagnosis of influenza from Nasopharyngeal swab specimens and should not be used as a sole basis for treatment. Nasal washings and aspirates are unacceptable for Xpert Xpress SARS-CoV-2/FLU/RSV testing.  Fact Sheet for Patients: BloggerCourse.com  Fact Sheet for Healthcare  Providers: SeriousBroker.it  This test is not yet approved or cleared by the [place] FDA and has been authorized for detection and/or diagnosis of SARS-CoV-2 by FDA under an Emergency Use Authorization (EUA). This EUA will remain in effect (meaning this test can be used) for the duration of the COVID-19 declaration under Section 564(b)(1) of the Act, 21 U.S.C. section 360bbb-3(b)(1), unless the authorization is terminated or revoked.  Performed at [facility], [address] [place], [place] [address]   Ethanol     Status: None   Collection Time: 12/21/20 10:24 PM  Result Value Ref Range   Alcohol, Ethyl (B) <  10 <10 mg/dL    Comment: (NOTE) Lowest detectable limit for serum alcohol is 10 mg/dL.  For medical purposes only. Performed at [facility], [address] [place], [place] [address]   Lactate dehydrogenase     Status: None   Collection Time: 12/21/20 10:24 PM  Result Value Ref Range   LDH 148 98 - 192 U/L    Comment: Performed at [facility], [address] [place], [place] [address]   US Abdomen Limited RUQ (LIVER/GB)  Result Date: 12/21/2020 CLINICAL DATA:  Pancreatitis. EXAM: ULTRASOUND ABDOMEN LIMITED RIGHT UPPER QUADRANT COMPARISON:  Ab[facility]elvic CT earlier [facility] FINDINGS: Gallbladder: Physiologically distended. No gallstones or wall thickening visualized. No sonographic Murphy sign noted by sonographer. Common bile duct: Diameter: 3-4 mm, normal. Liver: Diffusely increased parenchymal echogenicity. No discrete focal lesion. Portal vein is patent on color Doppler imaging with normal direction of blood flow towards the liver. Other: Small volume free fluid hepatorenal fossa. IMPRESSION: 1. Normal sonographic appearance of the gallbladder. No gallstones. No biliary dilatation. 2. Increased hepatic parenchymal echogenicity typical of steatosis. 3. Free fluid in  the hepatorenal fossa likely related to pancreatitis as seen on CT earlier today. Electronically Signed   By: Narda RutherfordMelanie  Sanford M.D.   On: 12/21/2020 23:58    Pending Labs Unresulted Labs (From admission, onward)    Start     Ordered   12/22/20 0500  Comprehensive metabolic panel  Tomorrow morning,   R        12/21/20 2223   12/22/20 0500  CBC  Tomorrow morning,   R        12/21/20 2223   12/22/20 0500  Lipase, blood  Tomorrow morning,   R        12/21/20 2336          Vitals/Pain Today's Vitals   12/21/20 2238 12/21/20 2321 12/21/20 2330 12/22/20 0000  BP:  (!) 171/87 (!) 156/91 (!) 156/91  Pulse:  (!) 52 (!) 52 (!) 52  Resp:  18 18   Temp:      TempSrc:      SpO2:  97% 100%   Weight:      Height:      PainSc: 5        Isolation Precautions No active isolations  Medications Medications  0.9 %  sodium chloride infusion ( Intravenous New Bag/Given 12/21/20 2037)  heparin injection 5,000 Units (has no administration in time range)  acetaminophen (TYLENOL) tablet 650 mg (has no administration in time range)    Or  acetaminophen (TYLENOL) suppository 650 mg (has no administration in time range)  oxyCODONE (Oxy IR/ROXICODONE) immediate release tablet 5 mg (has no administration in time range)  morphine 4 MG/ML injection 4 mg (has no administration in time range)  ondansetron (ZOFRAN) tablet 4 mg (has no administration in time range)    Or  ondansetron (ZOFRAN) injection 4 mg (has no administration in time range)  traZODone (DESYREL) tablet 50 mg (has no administration in time range)  LORazepam (ATIVAN) tablet 1-4 mg (has no administration in time range)    Or  LORazepam (ATIVAN) injection 1-4 mg (has no administration in time range)  thiamine tablet 100 mg (has no administration in time range)    Or  thiamine (B-1) injection 100 mg (has no administration in time range)  folic acid (FOLVITE) tablet 1 mg (has no administration in time range)  multivitamin with minerals  tablet 1 tablet (has  no administration in time range)  LORazepam (ATIVAN) injection 0-4 mg (0 mg Intravenous Not Given 12/22/20 0001)    Followed by  LORazepam (ATIVAN) injection 0-4 mg (has no administration in time range)  HYDROmorphone (DILAUDID) injection 0.5 mg (0.5 mg Intravenous Given 12/21/20 2038)  ondansetron (ZOFRAN) injection 4 mg (4 mg Intravenous Given 12/21/20 2038)  HYDROmorphone (DILAUDID) injection 0.5 mg (0.5 mg Intravenous Given 12/21/20 2210)    Mobility walks

## 2020-12-22 NOTE — Progress Notes (Signed)
PROGRESS NOTE  Angel Prince TIW:[id number] DOB: 18-Sep-1985   PCP: [name], MD  Patient is from: Home  DOA: 12/21/2020 LOS: 0  Chief complaints:  Chief Complaint  Patient presents with   Abdominal Pain   Vomiting     Brief Narrative / Interim history: 35 year old M with history of alcohol use presented to [facility] with LUQ pain and emesis, and found to have acute uncomplicated pancreatitis with elevated lipase and radiologic evidence, but left AMA to come to [facility] due to "poor pain management" , and admitted here.  Lipase elevated to 392.  LFT within normal.  RUQ [place] without significant finding other than possible hepatic steatosis and free fluid in the hepatorenal fossa.   Subjective: Seen and examined earlier this morning.  Still with LUQ pain radiating to his back but improved.  He rates the pain 4/10.  He denies nausea or vomiting.  Denies chest pain or dyspnea.  Objective: Vitals:   12/22/20 0000 12/22/20 0050 12/22/20 0437 12/22/20 0841  BP: (!) 156/91 137/66 132/78 (!) 161/89  Pulse: (!) 52 (!) 54 90 82  Resp:  20 20   Temp:  99.8 F (37.7 C) 99.3 F (37.4 C)   TempSrc:  Oral Oral   SpO2:  100% 98% 99%  Weight:      Height:        Intake/Output Summary (Last 24 hours) at 12/22/2020 1111 Last data filed at 12/22/2020 0600 Gross per 24 hour  Intake 500 ml  Output --  Net 500 ml   Filed Weights   12/21/20 2057 12/21/20 2137  Weight: 92.1 kg 92.1 kg    Examination:  GENERAL: No apparent distress.  Nontoxic. HEENT: MMM.  Vision and hearing grossly intact.  NECK: Supple.  No apparent JVD.  RESP: On RA.  No IWOB.  Fair aeration bilaterally. CVS:  RRR. Heart sounds normal.  ABD/GI/GU: BS+. Abd soft.  Tenderness over LUQ. MSK/EXT:  Moves extremities. No apparent deformity. No edema.  SKIN: no apparent skin lesion or wound NEURO: Awake, alert and oriented appropriately.  No apparent focal neuro deficit. PSYCH: Calm. Normal affect.   Procedures:   None  Microbiology summarized: COVID-19 and influenza PCR nonreactive.  Assessment & Plan: Acute alcoholic pancreatitis without necrosis, infection or pseudocyst-seems to have improved clinically but lipase uptrending.  LFT within normal.  RUQ [place] as above. Recent Labs  Lab 12/21/20 1808 12/22/20 0554  LIPASE 387* 833*  -Continue IV fluid, antiemetics and analgesics -Start clear liquid diet -Monitor lipase -Encouraged alcohol cessation.  Alcohol use-reports drinking about 1/5 of liquor.  Denies history of withdrawal symptoms or seizure -Continue CIWA with as needed Ativan -Multivitamin, thiamine and folic acid as above  Leukocytosis/bandemia: Likely demargination in the setting of the above.  Improved. -Continue monitoring  Microcytosis-Hgb normal. -May need a screening for hemoglobinopathy   Body mass index is 32.03 kg/m.         DVT prophylaxis:  enoxaparin (LOVENOX) injection 40 mg Start: 12/22/20 1200 SCDs Start: 12/21/20 2224  Code Status: Full code Family Communication: Patient and/or RN. Available if any question.  Level of care: Med-Surg Status is: Observation  The patient will require care spanning > 2 midnights and should be moved to inpatient because: Ongoing active pain requiring inpatient pain management, IV treatments appropriate due to intensity of illness or inability to take PO, and Inpatient level of care appropriate due to severity of illness  Dispo: The patient is from: Home  Anticipated d/c is to: Home              Patient currently is not medically stable to d/c.   Difficult to place patient No       Consultants:  None    Sch Meds:  Scheduled Meds:  enoxaparin (LOVENOX) injection  40 mg Subcutaneous Q24H   folic acid  1 mg Oral Daily   LORazepam  0-4 mg Intravenous Q6H   Followed by   Melene Muller ON 12/24/2020] LORazepam  0-4 mg Intravenous Q12H   multivitamin with minerals  1 tablet Oral Daily   thiamine  100 mg Oral  Daily   Or   thiamine  100 mg Intravenous Daily   Continuous Infusions:  sodium chloride 125 mL/hr at 12/22/20 0400   PRN Meds:.acetaminophen **OR** acetaminophen, LORazepam **OR** LORazepam, morphine injection, ondansetron **OR** ondansetron (ZOFRAN) IV, oxyCODONE, traZODone  Antimicrobials: Anti-infectives (From admission, onward)    None        I have personally reviewed the following labs and images: CBC: Recent Labs  Lab 12/21/20 1808 12/22/20 0554  WBC 18.4* 16.7*  NEUTROABS 16.7*  --   HGB 15.5 15.1  HCT 50.5 47.8  MCV 78.9* 75.6*  PLT 392 397   BMP &GFR Recent Labs  Lab 12/21/20 1808 12/22/20 0554  NA 136 135  K 4.0 4.1  CL 102 102  CO2 22 24  GLUCOSE 123* 113*  BUN 9 9  CREATININE 0.99 0.97  CALCIUM 9.5 9.2   Estimated Creatinine Clearance: 116.6 mL/min (by C-G formula based on SCr of 0.97 mg/dL). Liver & Pancreas: Recent Labs  Lab 12/21/20 1808 12/22/20 0554  AST 32 25  ALT 29 21  ALKPHOS 54 47  BILITOT 1.2 1.3*  PROT 8.1 7.3  ALBUMIN 4.7 4.2   Recent Labs  Lab 12/21/20 1808 12/22/20 0554  LIPASE 387* 833*   No results for input(s): AMMONIA in the last 168 hours. Diabetic: No results for input(s): HGBA1C in the last 72 hours. No results for input(s): GLUCAP in the last 168 hours. Cardiac Enzymes: No results for input(s): CKTOTAL, CKMB, CKMBINDEX, TROPONINI in the last 168 hours. No results for input(s): PROBNP in the last 8760 hours. Coagulation Profile: No results for input(s): INR, PROTIME in the last 168 hours. Thyroid Function Tests: No results for input(s): TSH, T4TOTAL, FREET4, T3FREE, THYROIDAB in the last 72 hours. Lipid Profile: No results for input(s): CHOL, HDL, LDLCALC, TRIG, CHOLHDL, LDLDIRECT in the last 72 hours. Anemia Panel: No results for input(s): VITAMINB12, FOLATE, FERRITIN, TIBC, IRON, RETICCTPCT in the last 72 hours. Urine analysis: No results found for: COLORURINE, APPEARANCEUR, LABSPEC, PHURINE,  GLUCOSEU, HGBUR, BILIRUBINUR, KETONESUR, PROTEINUR, UROBILINOGEN, NITRITE, LEUKOCYTESUR Sepsis Labs: Invalid input(s): PROCALCITONIN, LACTICIDVEN  Microbiology: Recent Results (from the past 240 hour(s))  Resp Panel by RT-PCR (Flu A&B, Covid) Nasopharyngeal Swab     Status: None   Collection Time: 12/21/20  8:44 PM   Specimen: Nasopharyngeal Swab; Nasopharyngeal(NP) swabs in vial transport medium  Result Value Ref Range Status   SARS Coronavirus 2 by RT PCR NEGATIVE NEGATIVE Final    Comment: (NOTE) SARS-CoV-2 target nucleic acids are NOT DETECTED.  The SARS-CoV-2 RNA is generally detectable in upper respiratory specimens during the acute phase of infection. The lowest concentration of SARS-CoV-2 viral copies this assay can detect is 138 copies/mL. A negative result does not preclude SARS-Cov-2 infection and should not be used as the sole basis for treatment or other patient management decisions. A negative result may  occur with  improper specimen collection/handling, submission of specimen other than nasopharyngeal swab, presence of viral mutation(s) within the areas targeted by this assay, and inadequate number of viral copies(<138 copies/mL). A negative result must be combined with clinical observations, patient history, and epidemiological information. The expected result is Negative.  Fact Sheet for Patients:  BloggerCourse.com  Fact Sheet for Healthcare Providers:  SeriousBroker.it  This test is no t yet approved or cleared by the [place] FDA and  has been authorized for detection and/or diagnosis of SARS-CoV-2 by FDA under an Emergency Use Authorization (EUA). This EUA will remain  in effect (meaning this test can be used) for the duration of the COVID-19 declaration under Section 564(b)(1) of the Act, 21 U.S.C.section 360bbb-3(b)(1), unless the authorization is terminated  or revoked sooner.       Influenza A by  PCR NEGATIVE NEGATIVE Final   Influenza B by PCR NEGATIVE NEGATIVE Final    Comment: (NOTE) The Xpert Xpress SARS-CoV-2/FLU/RSV plus assay is intended as an aid in the diagnosis of influenza from Nasopharyngeal swab specimens and should not be used as a sole basis for treatment. Nasal washings and aspirates are unacceptable for Xpert Xpress SARS-CoV-2/FLU/RSV testing.  Fact Sheet for Patients: BloggerCourse.com  Fact Sheet for Healthcare Providers: SeriousBroker.it  This test is not yet approved or cleared by the [place] FDA and has been authorized for detection and/or diagnosis of SARS-CoV-2 by FDA under an Emergency Use Authorization (EUA). This EUA will remain in effect (meaning this test can be used) for the duration of the COVID-19 declaration under Section 564(b)(1) of the Act, 21 U.S.C. section 360bbb-3(b)(1), unless the authorization is terminated or revoked.  Performed at [facility], [address], [place], [place] [address]     Radiology Studies: US Abdomen Limited RUQ (LIVER/GB)  Result Date: 12/21/2020 CLINICAL DATA:  Pancreatitis. EXAM: ULTRASOUND ABDOMEN LIMITED RIGHT UPPER QUADRANT COMPARISON:  Abdominopelvic CT earlier this day at [facility] FINDINGS: Gallbladder: Physiologically distended. No gallstones or wall thickening visualized. No sonographic Murphy sign noted by sonographer. Common bile duct: Diameter: 3-4 mm, normal. Liver: Diffusely increased parenchymal echogenicity. No discrete focal lesion. Portal vein is patent on color Doppler imaging with normal direction of blood flow towards the liver. Other: Small volume free fluid hepatorenal fossa. IMPRESSION: 1. Normal sonographic appearance of the gallbladder. No gallstones. No biliary dilatation. 2. Increased hepatic parenchymal echogenicity typical of steatosis. 3. Free fluid in the hepatorenal fossa likely related to pancreatitis as  seen on CT earlier today. Electronically Signed   By: Narda Rutherford M.D.   On: 12/21/2020 23:58      Stephanne Greeley T. Natalya Domzalski [facility]  If 7PM-7AM, please contact night-coverage [email or web address] 12/22/2020, 11:11 AM

## 2020-12-23 DIAGNOSIS — D72825 Bandemia: Secondary | ICD-10-CM

## 2020-12-23 DIAGNOSIS — E871 Hypo-osmolality and hyponatremia: Secondary | ICD-10-CM

## 2020-12-23 DIAGNOSIS — E876 Hypokalemia: Secondary | ICD-10-CM

## 2020-12-23 DIAGNOSIS — F1010 Alcohol abuse, uncomplicated: Secondary | ICD-10-CM

## 2020-12-23 LAB — COMPREHENSIVE METABOLIC PANEL
ALT: 15 U/L (ref 0–44)
AST: 19 U/L (ref 15–41)
Albumin: 3.2 g/dL — ABNORMAL LOW (ref 3.5–5.0)
Alkaline Phosphatase: 39 U/L (ref 38–126)
Anion gap: 8 (ref 5–15)
BUN: 8 mg/dL (ref 6–20)
CO2: 25 mmol/L (ref 22–32)
Calcium: 8.4 mg/dL — ABNORMAL LOW (ref 8.9–10.3)
Chloride: 101 mmol/L (ref 98–111)
Creatinine, Ser: 0.92 mg/dL (ref 0.61–1.24)
GFR, Estimated: >60 mL/min (ref >60)
Glucose, Bld: 79 mg/dL (ref 70–99)
Potassium: 3.4 mmol/L — ABNORMAL LOW (ref 3.5–5.1)
Sodium: 134 mmol/L — ABNORMAL LOW (ref 135–145)
Total Bilirubin: 1.1 mg/dL (ref 0.3–1.2)
Total Protein: 6.1 g/dL — ABNORMAL LOW (ref 6.5–8.1)

## 2020-12-23 LAB — CBC
HCT: 41.2 % (ref 39.0–52.0)
Hemoglobin: 13.2 g/dL (ref 13.0–17.0)
MCH: 24.3 pg — ABNORMAL LOW (ref 26.0–34.0)
MCHC: 32 g/dL (ref 30.0–36.0)
MCV: 75.9 fL — ABNORMAL LOW (ref 80.0–100.0)
Platelets: 318 10*3/uL (ref 150–400)
RBC: 5.43 MIL/uL (ref 4.22–5.81)
RDW: 14.4 % (ref 11.5–15.5)
WBC: 17.9 10*3/uL — ABNORMAL HIGH (ref 4.0–10.5)
nRBC: 0 % (ref 0.0–0.2)

## 2020-12-23 LAB — MAGNESIUM: Magnesium: 1.5 mg/dL — ABNORMAL LOW (ref 1.7–2.4)

## 2020-12-23 LAB — LIPASE, BLOOD: Lipase: 179 U/L — ABNORMAL HIGH (ref 11–51)

## 2020-12-23 LAB — PHOSPHORUS: Phosphorus: 1.9 mg/dL — ABNORMAL LOW (ref 2.5–4.6)

## 2020-12-23 MED ORDER — POTASSIUM CHLORIDE CRYS ER 10 MEQ PO TBCR
40.0000 meq | EXTENDED_RELEASE_TABLET | Freq: Once | ORAL | Status: AC
  Administered 2020-12-23: 40 meq via ORAL
  Filled 2020-12-23: qty 4

## 2020-12-23 MED ORDER — MAGNESIUM SULFATE 2 GM/50ML IV SOLN
2.0000 g | Freq: Once | INTRAVENOUS | Status: AC
  Administered 2020-12-23: 2 g via INTRAVENOUS
  Filled 2020-12-23: qty 50

## 2020-12-23 MED ORDER — POTASSIUM PHOSPHATES 15 MMOLE/5ML IV SOLN
30.0000 mmol | Freq: Once | INTRAVENOUS | Status: AC
  Administered 2020-12-23: 30 mmol via INTRAVENOUS
  Filled 2020-12-23: qty 10

## 2020-12-23 NOTE — Plan of Care (Signed)
Pt is injury free, afebrile, alert, and oriented x 4. VS within baseline. He complained of minor abdominal pain, which relieved with current pain regiment. He remains on clear liquid diet. He denies chest pain, nausea, SOB, or acute changes. Will continue to monitor   Problem: Education: Goal: Knowledge of Pancreatitis treatment and prevention will improve Outcome: Progressing   Problem: Nutritional: Goal: Ability to achieve adequate nutritional intake will improve Outcome: Progressing   Problem: Clinical Measurements: Goal: Complications related to the disease process, condition or treatment will be avoided or minimized Outcome: Progressing   Problem: Health Behavior/Discharge Planning: Goal: Ability to formulate a plan to maintain an alcohol-free life will improve Outcome: Progressing

## 2020-12-23 NOTE — Progress Notes (Signed)
PROGRESS NOTE  Angel Prince VEL:[id number] DOB: 1985-12-21   PCP: [name], MD  Patient is from: Home  DOA: 12/21/2020 LOS: 1  Chief complaints:  Chief Complaint  Patient presents with   Abdominal Pain   Vomiting     Brief Narrative / Interim history: 35 year old M with history of alcohol use presented to [facility] with LUQ pain and emesis, and found to have acute uncomplicated pancreatitis with elevated lipase and radiologic evidence, but left AMA to come to [facility] due to "poor pain management" , and admitted here.  Lipase elevated to 392.  LFT within normal.  RUQ [place] without significant finding other than possible hepatic steatosis and free fluid in the hepatorenal fossa.   Subjective: Seen and examined earlier this morning.  No major events overnight of this morning.  Reports improvement in his pain.  He rates his pain 3-4 on a scale of 10.  He denies nausea or vomiting.  Tolerating clear liquid diet.  He denies diarrhea, melena or hematochezia.  Denies cardiopulmonary symptoms or UTI symptoms.  Objective: Vitals:   12/22/20 2032 12/23/20 0011 12/23/20 0523 12/23/20 1246  BP: (!) 146/89 117/62 (!) 150/91 131/75  Pulse: (!) 103 83 (!) 106 69  Resp: 16 16 16 18   Temp: 99.5 F (37.5 C) 99.6 F (37.6 C) 99.1 F (37.3 C) 99.7 F (37.6 C)  TempSrc: Oral Oral Oral Oral  SpO2: 98% 97% 96% 97%  Weight:      Height:        Intake/Output Summary (Last 24 hours) at 12/23/2020 1455 Last data filed at 12/22/2020 1900 Gross per 24 hour  Intake 1428.51 ml  Output --  Net 1428.51 ml   Filed Weights   12/21/20 2057 12/21/20 2137  Weight: 92.1 kg 92.1 kg    Examination:  GENERAL: No apparent distress.  Nontoxic. HEENT: MMM.  Vision and hearing grossly intact.  NECK: Supple.  No apparent JVD.  RESP:  No IWOB.  Fair aeration bilaterally. CVS:  RRR. Heart sounds normal.  ABD/GI/GU: BS+. Abd soft.  Mild LUQ tenderness.  No rebound or guarding. MSK/EXT:  Moves extremities. No apparent  deformity. No edema.  SKIN: no apparent skin lesion or wound NEURO: Awake and alert. Oriented appropriately.  No apparent focal neuro deficit. PSYCH: Calm. Normal affect.   Procedures:  None  Microbiology summarized: COVID-19 and influenza PCR nonreactive.  Assessment & Plan: Acute alcoholic pancreatitis without necrosis, infection or pseudocyst-seems to have improved clinically but lipase uptrending.  LFT within normal.  RUQ 2138 as above.  Has leukocytosis and mild temp likely reactive versus infectious process. Recent Labs  Lab 12/21/20 1808 12/22/20 0554 12/23/20 0447  LIPASE 387* 833* 179*  -Continue IV fluid, antiemetics and analgesics -Advance to full liquid diet. -Monitor lipase and leukocytosis -Encouraged alcohol cessation.  Alcohol use-reports drinking about 1/5 of liquor.  Denies history of withdrawal symptoms or seizure -Continue CIWA with as needed Ativan -Multivitamin, thiamine and folic acid as above  Hypokalemia/hypomagnesemia/hypophosphatemia-likely due to alcohol and IV fluid. -Replenish and recheck  Hyponatremia: -Continue IV NS.  Leukocytosis/bandemia: Likely demargination in the setting of the above.  Stable. -Continue monitoring  Microcytosis-Hgb normal. -May need a screening for hemoglobinopathy   Body mass index is 32.03 kg/m.         DVT prophylaxis:  enoxaparin (LOVENOX) injection 40 mg Start: 12/22/20 2200 SCDs Start: 12/21/20 2224  Code Status: Full code Family Communication: Patient and/or RN. Available if any question.  Level of care: Med-Surg Status  is: Inpatient  Remains inpatient appropriate because:Persistent severe electrolyte disturbances, IV treatments appropriate due to intensity of illness or inability to take PO, and Inpatient level of care appropriate due to severity of illness  Dispo: The patient is from: Home              Anticipated d/c is to: Home              Patient currently is not medically stable to d/c.    Difficult to place patient No             Consultants:  None    Sch Meds:  Scheduled Meds:  enoxaparin (LOVENOX) injection  40 mg Subcutaneous Q24H   folic acid  1 mg Oral Daily   LORazepam  0-4 mg Oral Q6H   Followed by   Melene Muller ON 12/24/2020] LORazepam  0-4 mg Oral Q12H   multivitamin with minerals  1 tablet Oral Daily   thiamine  100 mg Oral Daily   Or   thiamine  100 mg Intravenous Daily   Continuous Infusions:  sodium chloride 125 mL/hr at 12/23/20 0417   potassium PHOSPHATE IVPB (in mmol) 30 mmol (12/23/20 1207)   PRN Meds:.acetaminophen **OR** acetaminophen, LORazepam **OR** LORazepam, morphine injection, ondansetron **OR** ondansetron (ZOFRAN) IV, oxyCODONE, traZODone  Antimicrobials: Anti-infectives (From admission, onward)    None        I have personally reviewed the following labs and images: CBC: Recent Labs  Lab 12/21/20 1808 12/22/20 0554 12/23/20 0447  WBC 18.4* 16.7* 17.9*  NEUTROABS 16.7*  --   --   HGB 15.5 15.1 13.2  HCT 50.5 47.8 41.2  MCV 78.9* 75.6* 75.9*  PLT 392 397 318   BMP &GFR Recent Labs  Lab 12/21/20 1808 12/22/20 0554 12/23/20 0447  NA 136 135 134*  K 4.0 4.1 3.4*  CL 102 102 101  CO2 22 24 25   GLUCOSE 123* 113* 79  BUN 9 9 8   CREATININE 0.99 0.97 0.92  CALCIUM 9.5 9.2 8.4*  MG  --   --  1.5*  PHOS  --   --  1.9*   Estimated Creatinine Clearance: 122.9 mL/min (by C-G formula based on SCr of 0.92 mg/dL). Liver & Pancreas: Recent Labs  Lab 12/21/20 1808 12/22/20 0554 12/23/20 0447  AST 32 25 19  ALT 29 21 15   ALKPHOS 54 47 39  BILITOT 1.2 1.3* 1.1  PROT 8.1 7.3 6.1*  ALBUMIN 4.7 4.2 3.2*   Recent Labs  Lab 12/21/20 1808 12/22/20 0554 12/23/20 0447  LIPASE 387* 833* 179*   No results for input(s): AMMONIA in the last 168 hours. Diabetic: No results for input(s): HGBA1C in the last 72 hours. No results for input(s): GLUCAP in the last 168 hours. Cardiac Enzymes: No results for input(s):  CKTOTAL, CKMB, CKMBINDEX, TROPONINI in the last 168 hours. No results for input(s): PROBNP in the last 8760 hours. Coagulation Profile: No results for input(s): INR, PROTIME in the last 168 hours. Thyroid Function Tests: No results for input(s): TSH, T4TOTAL, FREET4, T3FREE, THYROIDAB in the last 72 hours. Lipid Profile: No results for input(s): CHOL, HDL, LDLCALC, TRIG, CHOLHDL, LDLDIRECT in the last 72 hours. Anemia Panel: No results for input(s): VITAMINB12, FOLATE, FERRITIN, TIBC, IRON, RETICCTPCT in the last 72 hours. Urine analysis: No results found for: COLORURINE, APPEARANCEUR, LABSPEC, PHURINE, GLUCOSEU, HGBUR, BILIRUBINUR, KETONESUR, PROTEINUR, UROBILINOGEN, NITRITE, LEUKOCYTESUR Sepsis Labs: Invalid input(s): PROCALCITONIN, LACTICIDVEN  Microbiology: Recent Results (from the past 240 hour(s))  Resp Panel  by RT-PCR (Flu A&B, Covid) Nasopharyngeal Swab     Status: None   Collection Time: 12/21/20  8:44 PM   Specimen: Nasopharyngeal Swab; Nasopharyngeal(NP) swabs in vial transport medium  Result Value Ref Range Status   SARS Coronavirus 2 by RT PCR NEGATIVE NEGATIVE Final    Comment: (NOTE) SARS-CoV-2 target nucleic acids are NOT DETECTED.  The SARS-CoV-2 RNA is generally detectable in upper respiratory specimens during the acute phase of infection. The lowest concentration of SARS-CoV-2 viral copies this assay can detect is 138 copies/mL. A negative result does not preclude SARS-Cov-2 infection and should not be used as the sole basis for treatment or other patient management decisions. A negative result may occur with  improper specimen collection/handling, submission of specimen other than nasopharyngeal swab, presence of viral mutation(s) within the areas targeted by this assay, and inadequate number of viral copies(<138 copies/mL). A negative result must be combined with clinical observations, patient history, and epidemiological information. The expected result is  Negative.  Fact Sheet for Patients:  BloggerCourse.com  Fact Sheet for Healthcare Providers:  SeriousBroker.it  This test is no t yet approved or cleared by the [place] FDA and  has been authorized for detection and/or diagnosis of SARS-CoV-2 by FDA under an Emergency Use Authorization (EUA). This EUA will remain  in effect (meaning this test can be used) for the duration of the COVID-19 declaration under Section 564(b)(1) of the Act, 21 U.S.C.section 360bbb-3(b)(1), unless the authorization is terminated  or revoked sooner.       Influenza A by PCR NEGATIVE NEGATIVE Final   Influenza B by PCR NEGATIVE NEGATIVE Final    Comment: (NOTE) The Xpert Xpress SARS-CoV-2/FLU/RSV plus assay is intended as an aid in the diagnosis of influenza from Nasopharyngeal swab specimens and should not be used as a sole basis for treatment. Nasal washings and aspirates are unacceptable for Xpert Xpress SARS-CoV-2/FLU/RSV testing.  Fact Sheet for Patients: BloggerCourse.com  Fact Sheet for Healthcare Providers: SeriousBroker.it  This test is not yet approved or cleared by the [place] FDA and has been authorized for detection and/or diagnosis of SARS-CoV-2 by FDA under an Emergency Use Authorization (EUA). This EUA will remain in effect (meaning this test can be used) for the duration of the COVID-19 declaration under Section 564(b)(1) of the Act, 21 U.S.C. section 360bbb-3(b)(1), unless the authorization is terminated or revoked.  Performed at [facility], [address], [place], [place] [address]     Radiology Studies: No results found.    Xander Jutras T. Cameran Pettey [facility]  If 7PM-7AM, please contact night-coverage [email or web address] 12/23/2020, 2:55 PM

## 2020-12-24 LAB — CBC WITH DIFFERENTIAL/PLATELET
Abs Immature Granulocytes: 0.13 10*3/uL — ABNORMAL HIGH (ref 0.00–0.07)
Basophils Absolute: 0 10*3/uL (ref 0.0–0.1)
Basophils Relative: 0 %
Eosinophils Absolute: 0.3 10*3/uL (ref 0.0–0.5)
Eosinophils Relative: 1 %
HCT: 38.8 % — ABNORMAL LOW (ref 39.0–52.0)
Hemoglobin: 12.3 g/dL — ABNORMAL LOW (ref 13.0–17.0)
Immature Granulocytes: 1 %
Lymphocytes Relative: 9 %
Lymphs Abs: 1.6 10*3/uL (ref 0.7–4.0)
MCH: 23.8 pg — ABNORMAL LOW (ref 26.0–34.0)
MCHC: 31.7 g/dL (ref 30.0–36.0)
MCV: 75 fL — ABNORMAL LOW (ref 80.0–100.0)
Monocytes Absolute: 1 10*3/uL (ref 0.1–1.0)
Monocytes Relative: 5 %
Neutro Abs: 15.2 10*3/uL — ABNORMAL HIGH (ref 1.7–7.7)
Neutrophils Relative %: 84 %
Platelets: 300 10*3/uL (ref 150–400)
RBC: 5.17 MIL/uL (ref 4.22–5.81)
RDW: 14 % (ref 11.5–15.5)
WBC: 18.1 10*3/uL — ABNORMAL HIGH (ref 4.0–10.5)
nRBC: 0 % (ref 0.0–0.2)

## 2020-12-24 LAB — COMPREHENSIVE METABOLIC PANEL
ALT: 13 U/L (ref 0–44)
AST: 21 U/L (ref 15–41)
Albumin: 3 g/dL — ABNORMAL LOW (ref 3.5–5.0)
Alkaline Phosphatase: 44 U/L (ref 38–126)
Anion gap: 7 (ref 5–15)
BUN: 7 mg/dL (ref 6–20)
CO2: 27 mmol/L (ref 22–32)
Calcium: 8.4 mg/dL — ABNORMAL LOW (ref 8.9–10.3)
Chloride: 99 mmol/L (ref 98–111)
Creatinine, Ser: 0.85 mg/dL (ref 0.61–1.24)
GFR, Estimated: >60 mL/min (ref >60)
Glucose, Bld: 79 mg/dL (ref 70–99)
Potassium: 3.5 mmol/L (ref 3.5–5.1)
Sodium: 133 mmol/L — ABNORMAL LOW (ref 135–145)
Total Bilirubin: 1.1 mg/dL (ref 0.3–1.2)
Total Protein: 6.2 g/dL — ABNORMAL LOW (ref 6.5–8.1)

## 2020-12-24 LAB — PHOSPHORUS: Phosphorus: 2.1 mg/dL — ABNORMAL LOW (ref 2.5–4.6)

## 2020-12-24 LAB — LIPASE, BLOOD: Lipase: 83 U/L — ABNORMAL HIGH (ref 11–51)

## 2020-12-24 LAB — MAGNESIUM: Magnesium: 2 mg/dL (ref 1.7–2.4)

## 2020-12-24 MED ORDER — POTASSIUM PHOSPHATES 15 MMOLE/5ML IV SOLN
30.0000 mmol | Freq: Once | INTRAVENOUS | Status: DC
  Filled 2020-12-24: qty 10

## 2020-12-24 MED ORDER — POTASSIUM CHLORIDE CRYS ER 10 MEQ PO TBCR
40.0000 meq | EXTENDED_RELEASE_TABLET | Freq: Once | ORAL | Status: AC
  Administered 2020-12-24: 40 meq via ORAL
  Filled 2020-12-24: qty 4

## 2020-12-24 MED ORDER — OXYCODONE HCL 5 MG PO TABS: 5.0000 mg | ORAL_TABLET | Freq: Three times a day (TID) | ORAL | 0 refills | Status: AC | PRN

## 2020-12-24 NOTE — Discharge Summary (Signed)
Physician Discharge Summary  Angel Prince [id number] DOB: 08-Mar-1986 DOA: 12/21/2020  PCP: [name], MD  Admit date: 12/21/2020 Discharge date: 12/24/2020  Admitted From: Home Disposition: Home  Recommendations for Outpatient Follow-up:  Follow ups as below. Please obtain CBC/BMP/Mag/lipase at follow up Please follow up on the following pending results: None  Home Health: Not indicated Equipment/Devices: Not indicated  Discharge Condition: Stable CODE STATUS: Full code   Follow-up Information     [name], MD. Schedule an appointment as soon as possible for a visit in 1 week(s).   Specialty: Family Medicine Contact information: [address] [place] [address] [phone]                  Hospital Course: 35 year old M with history of alcohol use presented to [facility] with LUQ pain and emesis, and found to have acute uncomplicated pancreatitis with elevated lipase and radiologic evidence, but left AMA to come to [facility] due to "poor pain management" , and admitted here.  Lipase elevated to 392.  LFT within normal.  RUQ [place] without significant finding other than possible hepatic steatosis and free fluid in the hepatorenal fossa.   Lipase peaked at 800 and trended down to 80s.  Abdominal pain improved.  Patient tolerated soft diet and felt well and ready to go home.  Has been counseled on alcohol cessation.  Was given Rx for p.o. oxycodone 5 mg, #9 as needed for moderate to severe pain.  Outpatient follow-up with PCP as above.  Recommend repeat CBC, CMP and lipase at follow-up.  See individual problem list below for more  on hospital course. Discharge Diagnoses:  Acute alcoholic pancreatitis without necrosis, infection or pseudocyst-seems to have improved clinically but lipase uptrending.  LFT within normal.  RUQ [place] as above.  Has leukocytosis and mild temp likely reactive versus infectious process.  Tolerated soft diet.  Pain-free.  Recent Labs  Lab 12/21/20 1808  12/22/20 0554 12/23/20 0447 12/24/20 0430  LIPASE 387* 833* 179* 83*  -Patient to continue soft bland diet over the next few days -Encouraged alcohol cessation. -Repeat CBC, CMP and lipase at follow-up.   Alcohol use-reports drinking about 1/5 of liquor.  No withdrawal symptoms. -Alcohol cessation as above   Hypokalemia/hypomagnesemia/hypophosphatemia-resolved.   Hyponatremia: Stable   Leukocytosis/bandemia: Likely demargination in the setting of the above.  Stable. -Recheck CBC with differential at follow-up   Microcytosis-Hgb normal. -May need a screening for hemoglobinopathy   Body mass index is 32.03 kg/m.            Discharge Exam: Vitals:   12/24/20 0700 12/24/20 0900  BP: (!) 150/72 139/69  Pulse: 79   Resp: 20   Temp: 99.1 F (37.3 C)   SpO2: 98%     GENERAL: No apparent distress.  Nontoxic. HEENT: MMM.  Vision and hearing grossly intact.  NECK: Supple.  No apparent JVD.  RESP: On RA.  No IWOB.  Fair aeration bilaterally. CVS:  RRR. Heart sounds normal.  ABD/GI/GU: Bowel sounds present. Soft. Non tender.  MSK/EXT:  Moves extremities. No apparent deformity. No edema.  SKIN: no apparent skin lesion or wound NEURO: Awake, alert and oriented appropriately.  No apparent focal neuro deficit. PSYCH: Calm. Normal affect.   Discharge Instructions  Discharge Instructions     Call MD for:  difficulty breathing, headache or visual disturbances   Complete by: As directed    Call MD for:  extreme fatigue   Complete by: As directed  Call MD for:  persistant nausea and vomiting   Complete by: As directed    Call MD for:  severe uncontrolled pain   Complete by: As directed    Call MD for:  temperature >100.4   Complete by: As directed    Diet general   Complete by: As directed    Discharge instructions   Complete by: As directed    It has been a pleasure taking care of you!  You were hospitalized with acute pancreatitis (inflammation of the pancreas)  likely from alcohol.  Your symptoms improved to the point we think it safe to let you go home and follow-up with your primary care doctor.  We strongly recommend you avoid alcoholic beverage.  Please follow-up with your primary care doctor in 1 week to have your blood levels rechecked.   Take care,   Increase activity slowly   Complete by: As directed       Allergies as of 12/24/2020   Not on File      Medication List     TAKE these medications    azelastine 0.1 % nasal spray Commonly known as: ASTELIN Place 2 sprays into both nostrils 2 (two) times daily.   fluticasone 50 MCG/ACT nasal spray Commonly known as: FLONASE Place 2 sprays into both nostrils 2 (two) times daily.   montelukast 10 MG tablet Commonly known as: SINGULAIR TAKE 1 TABLET BY MOUTH EVERYDAY AT BEDTIME   oxyCODONE 5 MG immediate release tablet Commonly known as: Roxicodone Take 1 tablet (5 mg total) by mouth every 8 (eight) hours as needed for up to 3 days for severe pain or moderate pain.        Consultations: None  Procedures/Studies:   US Abdomen Limited RUQ (LIVER/GB)  Result Date: 12/21/2020 CLINICAL DATA:  Pancreatitis. EXAM: ULTRASOUND ABDOMEN LIMITED RIGHT UPPER QUADRANT COMPARISON:  Abdominopelvic CT earlier this day at [facility] FINDINGS: Gallbladder: Physiologically distended. No gallstones or wall thickening visualized. No sonographic Murphy sign noted by sonographer. Common bile duct: Diameter: 3-4 mm, normal. Liver: Diffusely increased parenchymal echogenicity. No discrete focal lesion. Portal vein is patent on color Doppler imaging with normal direction of blood flow towards the liver. Other: Small volume free fluid hepatorenal fossa. IMPRESSION: 1. Normal sonographic appearance of the gallbladder. No gallstones. No biliary dilatation. 2. Increased hepatic parenchymal echogenicity typical of steatosis. 3. Free fluid in the hepatorenal fossa likely related to pancreatitis as seen on CT  earlier today. Electronically Signed   By: Narda Rutherford M.D.   On: 12/21/2020 23:58       The results of significant diagnostics from this hospitalization (including imaging, microbiology, ancillary and laboratory) are listed below for reference.     Microbiology: Recent Results (from the past 240 hour(s))  Resp Panel by RT-PCR (Flu A&B, Covid) Nasopharyngeal Swab     Status: None   Collection Time: 12/21/20  8:44 PM   Specimen: Nasopharyngeal Swab; Nasopharyngeal(NP) swabs in vial transport medium  Result Value Ref Range Status   SARS Coronavirus 2 by RT PCR NEGATIVE NEGATIVE Final    Comment: (NOTE) SARS-CoV-2 target nucleic acids are NOT DETECTED.  The SARS-CoV-2 RNA is generally detectable in upper respiratory specimens during the acute phase of infection. The lowest concentration of SARS-CoV-2 viral copies this assay can detect is 138 copies/mL. A negative result does not preclude SARS-Cov-2 infection and should not be used as the sole basis for treatment or other patient management decisions. A negative result may occur with  improper specimen collection/handling, submission of specimen other than nasopharyngeal swab, presence of viral mutation(s) within the areas targeted by this assay, and inadequate number of viral copies(<138 copies/mL). A negative result must be combined with clinical observations, patient history, and epidemiological information. The expected result is Negative.  Fact Sheet for Patients:  BloggerCourse.com  Fact Sheet for Healthcare Providers:  SeriousBroker.it  This test is no t yet approved or cleared by the [place] FDA and  has been authorized for detection and/or diagnosis of SARS-CoV-2 by FDA under an Emergency Use Authorization (EUA). This EUA will remain  in effect (meaning this test can be used) for the duration of the COVID-19 declaration under Section 564(b)(1) of the Act,  21 U.S.C.section 360bbb-3(b)(1), unless the authorization is terminated  or revoked sooner.       Influenza A by PCR NEGATIVE NEGATIVE Final   Influenza B by PCR NEGATIVE NEGATIVE Final    Comment: (NOTE) The Xpert Xpress SARS-CoV-2/FLU/RSV plus assay is intended as an aid in the diagnosis of influenza from Nasopharyngeal swab specimens and should not be used as a sole basis for treatment. Nasal washings and aspirates are unacceptable for Xpert Xpress SARS-CoV-2/FLU/RSV testing.  Fact Sheet for Patients: BloggerCourse.com  Fact Sheet for Healthcare Providers: SeriousBroker.it  This test is not yet approved or cleared by the [place] FDA and has been authorized for detection and/or diagnosis of SARS-CoV-2 by FDA under an Emergency Use Authorization (EUA). This EUA will remain in effect (meaning this test can be used) for the duration of the COVID-19 declaration under Section 564(b)(1) of the Act, 21 U.S.C. section 360bbb-3(b)(1), unless the authorization is terminated or revoked.  Performed at [facility], [address], [place], [place] [address]      Labs:  CBC: Recent Labs  Lab 12/21/20 1808 12/22/20 0554 12/23/20 0447 12/24/20 0430  WBC 18.4* 16.7* 17.9* 18.1*  NEUTROABS 16.7*  --   --  15.2*  HGB 15.5 15.1 13.2 12.3*  HCT 50.5 47.8 41.2 38.8*  MCV 78.9* 75.6* 75.9* 75.0*  PLT 392 397 318 300   BMP &GFR Recent Labs  Lab 12/21/20 1808 12/22/20 0554 12/23/20 0447 12/24/20 0430  NA 136 135 134* 133*  K 4.0 4.1 3.4* 3.5  CL 102 102 101 99  CO2 22 24 25 27   GLUCOSE 123* 113* 79 79  BUN 9 9 8 7   CREATININE 0.99 0.97 0.92 0.85  CALCIUM 9.5 9.2 8.4* 8.4*  MG  --   --  1.5* 2.0  PHOS  --   --  1.9* 2.1*   Estimated Creatinine Clearance: 133.1 mL/min (by C-G formula based on SCr of 0.85 mg/dL). Liver & Pancreas: Recent Labs  Lab 12/21/20 1808 12/22/20 0554 12/23/20 0447  12/24/20 0430  AST 32 25 19 21   ALT 29 21 15 13   ALKPHOS 54 47 39 44  BILITOT 1.2 1.3* 1.1 1.1  PROT 8.1 7.3 6.1* 6.2*  ALBUMIN 4.7 4.2 3.2* 3.0*   Recent Labs  Lab 12/21/20 1808 12/22/20 0554 12/23/20 0447 12/24/20 0430  LIPASE 387* 833* 179* 83*   No results for input(s): AMMONIA in the last 168 hours. Diabetic: No results for input(s): HGBA1C in the last 72 hours. No results for input(s): GLUCAP in the last 168 hours. Cardiac Enzymes: No results for input(s): CKTOTAL, CKMB, CKMBINDEX, TROPONINI in the last 168 hours. No results for input(s): PROBNP in the last 8760 hours. Coagulation Profile: No results for input(s): INR, PROTIME in the last  168 hours. Thyroid Function Tests: No results for input(s): TSH, T4TOTAL, FREET4, T3FREE, THYROIDAB in the last 72 hours. Lipid Profile: No results for input(s): CHOL, HDL, LDLCALC, TRIG, CHOLHDL, LDLDIRECT in the last 72 hours. Anemia Panel: No results for input(s): VITAMINB12, FOLATE, FERRITIN, TIBC, IRON, RETICCTPCT in the last 72 hours. Urine analysis: No results found for: COLORURINE, APPEARANCEUR, LABSPEC, PHURINE, GLUCOSEU, HGBUR, BILIRUBINUR, KETONESUR, PROTEINUR, UROBILINOGEN, NITRITE, LEUKOCYTESUR Sepsis Labs: Invalid input(s): PROCALCITONIN, LACTICIDVEN   Time coordinating discharge: 35 minutes  SIGNED:  Almon Herculesaye T Ceniya Fowers, MD  [facility] 12/24/2020, 3:24 PM  If 7PM-7AM, please contact night-coverage [email or web address]

## 2021-04-27 ENCOUNTER — Encounter (HOSPITAL_COMMUNITY): Payer: Self-pay

## 2021-04-27 ENCOUNTER — Inpatient Hospital Stay (HOSPITAL_COMMUNITY)
Admission: EM | Admit: 2021-04-27 | Discharge: 2021-04-30 | DRG: 440 | Disposition: A | Discharge status: Home / Self Care | Payer: BC Managed Care – PPO | Attending: Internal Medicine | Admitting: Internal Medicine

## 2021-04-27 ENCOUNTER — Other Ambulatory Visit: Payer: Self-pay

## 2021-04-27 DIAGNOSIS — F1020 Alcohol dependence, uncomplicated: Secondary | ICD-10-CM | POA: Diagnosis present

## 2021-04-27 DIAGNOSIS — K852 Alcohol induced acute pancreatitis without necrosis or infection: Secondary | ICD-10-CM | POA: Diagnosis not present

## 2021-04-27 DIAGNOSIS — Z20822 Contact with and (suspected) exposure to covid-19: Secondary | ICD-10-CM | POA: Diagnosis present

## 2021-04-27 DIAGNOSIS — K859 Acute pancreatitis without necrosis or infection, unspecified: Secondary | ICD-10-CM | POA: Diagnosis not present

## 2021-04-27 DIAGNOSIS — D509 Iron deficiency anemia, unspecified: Secondary | ICD-10-CM | POA: Diagnosis present

## 2021-04-27 HISTORY — DX: Alcohol dependence, uncomplicated: F10.20

## 2021-04-27 HISTORY — DX: Alcohol induced acute pancreatitis without necrosis or infection: K85.20

## 2021-04-27 NOTE — ED Triage Notes (Signed)
Pt reports with upper abdominal pain since this morning. Pt states that it feels like it does when he had pancreatitis before. Pt states that he had some vodka last night.

## 2021-04-28 ENCOUNTER — Emergency Department (HOSPITAL_COMMUNITY): Payer: BC Managed Care – PPO

## 2021-04-28 ENCOUNTER — Encounter (HOSPITAL_COMMUNITY): Payer: Self-pay

## 2021-04-28 DIAGNOSIS — D509 Iron deficiency anemia, unspecified: Secondary | ICD-10-CM | POA: Diagnosis present

## 2021-04-28 DIAGNOSIS — K859 Acute pancreatitis without necrosis or infection, unspecified: Secondary | ICD-10-CM | POA: Diagnosis present

## 2021-04-28 DIAGNOSIS — Z20822 Contact with and (suspected) exposure to covid-19: Secondary | ICD-10-CM | POA: Diagnosis present

## 2021-04-28 DIAGNOSIS — F1020 Alcohol dependence, uncomplicated: Secondary | ICD-10-CM | POA: Diagnosis present

## 2021-04-28 DIAGNOSIS — K852 Alcohol induced acute pancreatitis without necrosis or infection: Secondary | ICD-10-CM | POA: Diagnosis present

## 2021-04-28 LAB — CBC WITH DIFFERENTIAL/PLATELET
Abs Immature Granulocytes: 0.04 10*3/uL (ref 0.00–0.07)
Basophils Absolute: 0 10*3/uL (ref 0.0–0.1)
Basophils Relative: 0 %
Eosinophils Absolute: 0 10*3/uL (ref 0.0–0.5)
Eosinophils Relative: 0 %
HCT: 42.4 % (ref 39.0–52.0)
Hemoglobin: 13.6 g/dL (ref 13.0–17.0)
Immature Granulocytes: 0 %
Lymphocytes Relative: 9 %
Lymphs Abs: 1.4 10*3/uL (ref 0.7–4.0)
MCH: 24 pg — ABNORMAL LOW (ref 26.0–34.0)
MCHC: 32.1 g/dL (ref 30.0–36.0)
MCV: 74.8 fL — ABNORMAL LOW (ref 80.0–100.0)
Monocytes Absolute: 0.6 10*3/uL (ref 0.1–1.0)
Monocytes Relative: 4 %
Neutro Abs: 13.1 10*3/uL — ABNORMAL HIGH (ref 1.7–7.7)
Neutrophils Relative %: 87 %
Platelets: 420 10*3/uL — ABNORMAL HIGH (ref 150–400)
RBC: 5.67 MIL/uL (ref 4.22–5.81)
RDW: 14.8 % (ref 11.5–15.5)
WBC: 15.2 10*3/uL — ABNORMAL HIGH (ref 4.0–10.5)
nRBC: 0 % (ref 0.0–0.2)

## 2021-04-28 LAB — COMPREHENSIVE METABOLIC PANEL
ALT: 48 U/L — ABNORMAL HIGH (ref 0–44)
AST: 33 U/L (ref 15–41)
Albumin: 4.8 g/dL (ref 3.5–5.0)
Alkaline Phosphatase: 58 U/L (ref 38–126)
Anion gap: 13 (ref 5–15)
BUN: 15 mg/dL (ref 6–20)
CO2: 22 mmol/L (ref 22–32)
Calcium: 9.7 mg/dL (ref 8.9–10.3)
Chloride: 101 mmol/L (ref 98–111)
Creatinine, Ser: 0.82 mg/dL (ref 0.61–1.24)
GFR, Estimated: >60 mL/min (ref >60)
Glucose, Bld: 97 mg/dL (ref 70–99)
Potassium: 3.9 mmol/L (ref 3.5–5.1)
Sodium: 136 mmol/L (ref 135–145)
Total Bilirubin: 1.2 mg/dL (ref 0.3–1.2)
Total Protein: 8.4 g/dL — ABNORMAL HIGH (ref 6.5–8.1)

## 2021-04-28 LAB — URINALYSIS, ROUTINE W REFLEX MICROSCOPIC
Bilirubin Urine: NEGATIVE
Glucose, UA: NEGATIVE mg/dL
Hgb urine dipstick: NEGATIVE
Ketones, ur: 20 mg/dL — AB
Leukocytes,Ua: NEGATIVE
Nitrite: NEGATIVE
Protein, ur: NEGATIVE mg/dL
Specific Gravity, Urine: 1.027 (ref 1.005–1.030)
pH: 5 (ref 5.0–8.0)

## 2021-04-28 LAB — ETHANOL: Alcohol, Ethyl (B): <10 mg/dL (ref <10)

## 2021-04-28 LAB — RESP PANEL BY RT-PCR (FLU A&B, COVID) ARPGX2
Influenza A by PCR: NEGATIVE
Influenza B by PCR: NEGATIVE
SARS Coronavirus 2 by RT PCR: NEGATIVE

## 2021-04-28 LAB — LIPASE, BLOOD: Lipase: 350 U/L — ABNORMAL HIGH (ref 11–51)

## 2021-04-28 MED ORDER — ENOXAPARIN SODIUM 40 MG/0.4ML IJ SOSY
40.0000 mg | PREFILLED_SYRINGE | INTRAMUSCULAR | Status: DC
  Administered 2021-04-28 – 2021-04-30 (×3): 40 mg via SUBCUTANEOUS
  Filled 2021-04-28 (×3): qty 0.4

## 2021-04-28 MED ORDER — THIAMINE HCL 100 MG PO TABS
100.0000 mg | ORAL_TABLET | Freq: Every day | ORAL | Status: DC
  Administered 2021-04-28 – 2021-04-30 (×3): 100 mg via ORAL
  Filled 2021-04-28 (×3): qty 1

## 2021-04-28 MED ORDER — LORAZEPAM 2 MG/ML IJ SOLN
0.0000 mg | Freq: Four times a day (QID) | INTRAMUSCULAR | Status: DC
  Administered 2021-04-28: 1 mg via INTRAVENOUS
  Filled 2021-04-28: qty 1

## 2021-04-28 MED ORDER — MORPHINE SULFATE (PF) 2 MG/ML IV SOLN
2.0000 mg | INTRAVENOUS | Status: DC | PRN
  Administered 2021-04-28 – 2021-04-29 (×4): 2 mg via INTRAVENOUS
  Filled 2021-04-28 (×4): qty 1

## 2021-04-28 MED ORDER — THIAMINE HCL 100 MG/ML IJ SOLN: 100.0000 mg | Freq: Every day | INTRAMUSCULAR | Status: DC

## 2021-04-28 MED ORDER — ONDANSETRON HCL 4 MG/2ML IJ SOLN
4.0000 mg | Freq: Once | INTRAMUSCULAR | Status: AC
  Administered 2021-04-28: 4 mg via INTRAVENOUS
  Filled 2021-04-28: qty 2

## 2021-04-28 MED ORDER — LORAZEPAM 2 MG/ML IJ SOLN: 0.0000 mg | Freq: Two times a day (BID) | INTRAMUSCULAR | Status: DC

## 2021-04-28 MED ORDER — THIAMINE HCL 100 MG PO TABS: 100.0000 mg | ORAL_TABLET | Freq: Every day | ORAL | Status: DC

## 2021-04-28 MED ORDER — FOLIC ACID 1 MG PO TABS
1.0000 mg | ORAL_TABLET | Freq: Every day | ORAL | Status: DC
  Administered 2021-04-28 – 2021-04-30 (×3): 1 mg via ORAL
  Filled 2021-04-28 (×3): qty 1

## 2021-04-28 MED ORDER — LORAZEPAM 2 MG/ML IJ SOLN: 1.0000 mg | INTRAMUSCULAR | Status: DC | PRN

## 2021-04-28 MED ORDER — ADULT MULTIVITAMIN W/MINERALS CH
1.0000 | ORAL_TABLET | Freq: Every day | ORAL | Status: DC
  Administered 2021-04-28 – 2021-04-30 (×3): 1 via ORAL
  Filled 2021-04-28 (×3): qty 1

## 2021-04-28 MED ORDER — LACTATED RINGERS IV BOLUS
1000.0000 mL | Freq: Once | INTRAVENOUS | Status: AC
  Administered 2021-04-28: 1000 mL via INTRAVENOUS

## 2021-04-28 MED ORDER — LORAZEPAM 1 MG PO TABS: 0.0000 mg | ORAL_TABLET | Freq: Four times a day (QID) | ORAL | Status: DC

## 2021-04-28 MED ORDER — LORAZEPAM 1 MG PO TABS: 1.0000 mg | ORAL_TABLET | ORAL | Status: DC | PRN

## 2021-04-28 MED ORDER — LACTATED RINGERS IV SOLN: INTRAVENOUS | Status: DC

## 2021-04-28 MED ORDER — HYDROMORPHONE HCL 1 MG/ML IJ SOLN
1.0000 mg | Freq: Once | INTRAMUSCULAR | Status: AC
Start: 2021-04-28 — End: 2021-04-28
  Administered 2021-04-28: 1 mg via INTRAVENOUS
  Filled 2021-04-28: qty 1

## 2021-04-28 MED ORDER — ACETAMINOPHEN 325 MG PO TABS: 650.0000 mg | ORAL_TABLET | Freq: Four times a day (QID) | ORAL | Status: DC | PRN

## 2021-04-28 MED ORDER — IOHEXOL 350 MG/ML SOLN
80.0000 mL | Freq: Once | INTRAVENOUS | Status: AC | PRN
Start: 2021-04-28 — End: 2021-04-28
  Administered 2021-04-28: 80 mL via INTRAVENOUS

## 2021-04-28 MED ORDER — ACETAMINOPHEN 650 MG RE SUPP: 650.0000 mg | Freq: Four times a day (QID) | RECTAL | Status: DC | PRN

## 2021-04-28 MED ORDER — HYDROMORPHONE HCL 1 MG/ML IJ SOLN
1.0000 mg | Freq: Once | INTRAMUSCULAR | Status: AC | PRN
  Administered 2021-04-28: 1 mg via INTRAVENOUS
  Filled 2021-04-28: qty 1

## 2021-04-28 MED ORDER — ONDANSETRON HCL 4 MG/2ML IJ SOLN: 4.0000 mg | Freq: Four times a day (QID) | INTRAMUSCULAR | Status: DC | PRN

## 2021-04-28 MED ORDER — ONDANSETRON HCL 4 MG PO TABS: 4.0000 mg | ORAL_TABLET | Freq: Four times a day (QID) | ORAL | Status: DC | PRN

## 2021-04-28 MED ORDER — PNEUMOCOCCAL VAC POLYVALENT 25 MCG/0.5ML IJ INJ
0.5000 mL | INJECTION | INTRAMUSCULAR | Status: DC
  Filled 2021-04-28: qty 0.5

## 2021-04-28 MED ORDER — LORAZEPAM 1 MG PO TABS: 0.0000 mg | ORAL_TABLET | Freq: Two times a day (BID) | ORAL | Status: DC

## 2021-04-28 MED ORDER — HYDRALAZINE HCL 20 MG/ML IJ SOLN: 5.0000 mg | INTRAMUSCULAR | Status: DC | PRN

## 2021-04-28 NOTE — Assessment & Plan Note (Addendum)
-  Patient with prior h/o acute pancreatitis in 12/2020 presenting with similar symptoms -Now with frank pancreatitis by H&P, mildly elevated lipase, and CT -Will admit to Med Surg -Strict NPO for now -Aggressive IVF hydration at least for the first 12 hours with LR at 200 cc/hr -Pain control with morphine 2 mg q2h prn.   -Nausea control with Zofran -He has been strongly encouraged to stop drinking completely, as he has proven to be "allergic"

## 2021-04-28 NOTE — Assessment & Plan Note (Signed)
-  Patient with chronic ETOH dependence -This is his second admission for alcohol-related pancreatitis in last few months -CIWA protocol -Folate, thiamine, and MVI ordered -Will provide symptom-triggered BZD (ativan per CIWA protocol) only since the patient is able to communicate; is not showing current signs of delirium; and has no history of severe withdrawal. -TOC team consult for substance abuse education -Will also check UDS. -Elevated LFTs are likely related to alcoholism -Consider offering a medication for Alcohol Use Disorder at the time of d/c, to include Disulfuram; Naltrexone; or Acamprosate.

## 2021-04-28 NOTE — TOC Initial Note (Signed)
Transition of Care [facility]) - Initial/Assessment Note    Patient Details  Name: Angel Prince MRN: [id number] Date of Birth: [birth year]  [facility]) CM/SW Contact:    Aseret Hoffman, Marjie Skiff, RN Phone Number: 04/28/2021, 12:22 PM   Pt has been given resources for Alcohol abuse on prior admission. Resources placed on AVS.    Activities of Daily Living Home Assistive Devices/Equipment: None ADL Screening (condition at time of admission) Patient's cognitive ability adequate to safely complete daily activities?: Yes Is the patient deaf or have difficulty hearing?: No Does the patient have difficulty seeing, even when wearing glasses/contacts?: No Does the patient have difficulty concentrating, remembering, or making decisions?: No Patient able to express need for assistance with ADLs?: Yes Does the patient have difficulty dressing or bathing?: No Independently performs ADLs?: Yes (appropriate for developmental age) Does the patient have difficulty walking or climbing stairs?: No Weakness of Legs: None Weakness of Arms/Hands: None   Admission diagnosis:  Acute pancreatitis [K85.90] Acute pancreatitis, unspecified complication status, unspecified pancreatitis type [id number] Acute alcoholic pancreatitis [id number] Patient Active Problem List   Diagnosis Date Noted   Alcohol dependence syndrome ([place]) 123456   Acute alcoholic pancreatitis [id number]   Pancreatitis 12/21/2020   Seasonal and perennial allergic rhinitis 03/26/2018   Rhinitis medicamentosa 03/26/2018   PCP:  Caryl Bis, MD Pharmacy:   [id number] [place], [place] [address] [place] [address] Phone: [phone] Fax: [phone]     Social Determinants of Health (SDOH) Interventions    Readmission Risk Interventions No flowsheet data found.

## 2021-04-28 NOTE — H&P (Signed)
History and Physical    Patient: Angel Prince NOM:[id number] DOB: 09/14/1985 DOA: 04/27/2021 DOS: the patient was seen and examined on 04/28/2021 PCP: [name], MD  Patient coming from: Home - lives with Steffanie Rainwater; NOKSteffanie Rainwater, [phone]   Chief Complaint: Abdominal pain  HPI: Angel Prince is a 36 y.o. male with medical history significant of ETOH dependence presenting with abdominal pain. He reports that had a pancreas flare.  It's been hurting pretty bad.  He has had this issue once before.  He started feeling bad yesterday AM.  He was having L-sided abdominal pain.  +n/v.  Last emesis was yesterday.  He has not tried anything to eat or drink.  The pain is better but still present.  He is mildly nauseated now.  He drinks 1/2 jar of vodka every other day.   His last drink was 2 days ago.    He was previously admitted for the same issue from 9/3-6.  He quit drinking after that admission and relapsed but reports that he wasn't drinking as much as prior.     ER Course:  Carryover, per Dr. Loney Loh:  36 year old with alcoholism presenting with nausea, vomiting, and upper abdominal pain.  Lipase elevated to the 300s and WBC count 15.  CT showing acute uncomplicated pancreatitis.  He was given a liter of fluid and started on CIWA protocol.     Review of Systems: As mentioned in the history of present illness. All other systems reviewed and are negative. Past Medical History:  Diagnosis Date   Alcohol dependence (HCC)    Alcoholic pancreatitis    History reviewed. No pertinent surgical history. Social History:  reports that he has never smoked. He has never used smokeless tobacco. He reports current alcohol use. He reports that he does not currently use drugs.  No Known Allergies  History reviewed. No pertinent family history.  Prior to Admission medications   Not on File    Physical Exam: Vitals:   04/28/21 0230 04/28/21 0300 04/28/21 0355 04/28/21 0439  BP: (!) 155/85  (!) 148/90 140/90 133/74  Pulse: (!) 59 91 96 64  Resp: 17 16  18   Temp:    98.7 F (37.1 C)  TempSrc:    Oral  SpO2: 98% 99%  98%  Weight:      Height:       General:  Appears calm and comfortable and is in NAD Eyes:  EOMI, normal lids, iris ENT:  grossly normal hearing, lips & tongue, mmm; appropriate dentition Neck:  no LAD, masses or thyromegaly Cardiovascular:  RRR, no m/r/g. No LE edema.  Respiratory:   CTA bilaterally with no wheezes/rales/rhonchi.  Normal respiratory effort. Abdomen:  soft, midepigastric TTP, ND Skin:  no rash or induration seen on limited exam Musculoskeletal:  grossly normal tone BUE/BLE, good ROM, no bony abnormality Psychiatric: blunted mood and affect, speech fluent and appropriate, AOx3 Neurologic:  CN 2-12 grossly intact, moves all extremities in coordinated fashion   Radiological Exams on Admission: Independently reviewed - see discussion in A/P where applicable  CT ABDOMEN PELVIS W CONTRAST  Result Date: 04/28/2021 CLINICAL DATA:  Nausea and vomiting. EXAM: CT ABDOMEN AND PELVIS WITH CONTRAST TECHNIQUE: Multidetector CT imaging of the abdomen and pelvis was performed using the standard protocol following bolus administration of intravenous contrast. CONTRAST:  12mL OMNIPAQUE IOHEXOL 350 MG/ML SOLN COMPARISON:  CT abdomen pelvis dated 12/21/2020. FINDINGS: Lower chest: The visualized lung bases are clear. No intra-abdominal free air or free fluid. Hepatobiliary:  No focal liver abnormality is seen. No gallstones, gallbladder wall thickening, or biliary dilatation. Pancreas: There is diffuse inflammatory changes of the pancreas consistent with acute pancreatitis. No drainable fluid collection/abscess or pseudocyst. Spleen: Normal in size without focal abnormality. Adrenals/Urinary Tract: The adrenal glands are unremarkable. Small focal hypodensity in the upper pole of the right kidney may represent an area of parenchymal scarring or infarct versus a small  cyst. There is no hydronephrosis on either side. The the visualized ureters and urinary bladder appear unremarkable. Stomach/Bowel: No bowel obstruction or active inflammation. The appendix is normal. Vascular/Lymphatic: The abdominal aorta and IVC are unremarkable. No portal venous gas. There is no adenopathy. Reproductive: The prostate and seminal vesicles are grossly unremarkable. Other: None Musculoskeletal: No acute or significant osseous findings. IMPRESSION: 1. Acute pancreatitis. No abscess or pseudocyst. 2. No bowel obstruction. Normal appendix. Electronically Signed   By: Anner Crete M.D.   On: 04/28/2021 02:30    EKG: not done   Labs on Admission: I have personally reviewed the available labs and imaging studies at the time of the admission.  Pertinent labs:    Normal BMP Lipase 350 AST 33/ALT 48 WBC 15.2 COVID/flu negative UA: 20 ketones ETOH <10   Assessment/Plan * Acute alcoholic pancreatitis- (present on admission) -Patient with prior h/o acute pancreatitis in 12/2020 presenting with similar symptoms -Now with frank pancreatitis by H&P, mildly elevated lipase, and CT -Will observe for now -Strict NPO for now -Aggressive IVF hydration at least for the first 12 hours with LR at 200 cc/hr -Pain control with morphine 2 mg q2h prn.   -Nausea control with Zofran -He has been strongly encouraged to stop drinking completely, as he has proven to be "allergic"  Alcohol dependence syndrome ([place])- (present on admission) -Patient with chronic ETOH dependence -This is his second admission for alcohol-related pancreatitis in last few months -CIWA protocol -Folate, thiamine, and MVI ordered -Will provide symptom-triggered BZD (ativan per CIWA protocol) only since the patient is able to communicate; is not showing current signs of delirium; and has no history of severe withdrawal. -TOC team consult for substance abuse education -Will also check UDS. -Elevated LFTs are likely  related to alcoholism -Consider offering a medication for Alcohol Use Disorder at the time of d/c, to include Disulfuram; Naltrexone; or Acamprosate.    Advance Care Planning:   Code Status: Full Code   Consults: TOC team  Family Communication: Celesta Gentile was present throughout evaluation  Severity of Illness: Admit - It is my clinical opinion that admission to INPATIENT is reasonable and necessary because of the expectation that this patient will require hospital care that crosses at least 2 midnights to treat this condition based on the medical complexity of the problems presented.  Given the aforementioned information, the predictability of an adverse outcome is felt to be significant.   Author: Karmen Bongo, MD 04/28/2021 8:59 AM  For on call review [email or web address].

## 2021-04-28 NOTE — ED Provider Notes (Signed)
[facility]-EMERGENCY DEPT Provider Note   CSN: [id number] Arrival date & time: 04/27/21  2319     History  Chief Complaint  Patient presents with   Pancreatitis    Angel Prince is a 36 y.o. male.  HPI Patient is a 36 year old male with a history of pancreatitis who presents to the emergency department due to upper abdominal pain.  Patient states that he has been hospitalized with pancreatitis in the past.  He states that he is continue to drink alcohol typically drinks about one half a pint of liquor every other day.  He states that he drank a pint of vodka last night.  He woke up with his abdominal pain this morning.  Reports associated nausea and vomiting.  States it is nonbilious and nonbloody.  No diarrhea.  No chest pain or shortness of breath.    Home Medications Prior to Admission medications   Not on File      Allergies    Patient has no known allergies.    Review of Systems   Review of Systems  All other systems reviewed and are negative. Ten systems reviewed and are negative for acute change, except as noted in the HPI.   Physical Exam Updated Vital Signs BP 140/90    Pulse 96    Temp 98.6 F (37 C) (Oral)    Resp 16    Ht 5\' 6"  (1.676 m)    Wt 86.2 kg    SpO2 99%    BMI 30.67 kg/m  Physical Exam Vitals and nursing note reviewed.  Constitutional:      General: He is not in acute distress.    Appearance: Normal appearance. He is not ill-appearing, toxic-appearing or diaphoretic.  HENT:     Head: Normocephalic and atraumatic.     Right Ear: External ear normal.     Left Ear: External ear normal.     Nose: Nose normal.     Mouth/Throat:     Mouth: Mucous membranes are moist.     Pharynx: Oropharynx is clear. No oropharyngeal exudate or posterior oropharyngeal erythema.  Eyes:     General: No scleral icterus.       Right eye: No discharge.        Left eye: No discharge.     Extraocular Movements: Extraocular movements intact.      Conjunctiva/sclera: Conjunctivae normal.  Cardiovascular:     Rate and Rhythm: Normal rate and regular rhythm.     Pulses: Normal pulses.     Heart sounds: Normal heart sounds. No murmur heard.   No friction rub. No gallop.  Pulmonary:     Effort: Pulmonary effort is normal. No respiratory distress.     Breath sounds: Normal breath sounds. No stridor. No wheezing, rhonchi or rales.  Abdominal:     General: Abdomen is flat.     Palpations: Abdomen is soft.     Tenderness: There is abdominal tenderness.     Comments: Abdomen is flat and soft.  Mild to moderate tenderness noted along the epigastrium as well as the left lateral abdomen.  Musculoskeletal:        General: Normal range of motion.     Cervical back: Normal range of motion and neck supple. No tenderness.  Skin:    General: Skin is warm and dry.  Neurological:     General: No focal deficit present.     Mental Status: He is alert and oriented to person, place, and time.  Psychiatric:        Mood and Affect: Mood normal.        Behavior: Behavior normal.    ED Results / Procedures / Treatments   Labs (all labs ordered are listed, but only abnormal results are displayed) Labs Reviewed  CBC WITH DIFFERENTIAL/PLATELET - Abnormal; Notable for the following components:      Result Value   WBC 15.2 (*)    MCV 74.8 (*)    MCH 24.0 (*)    Platelets 420 (*)    Neutro Abs 13.1 (*)    All other components within normal limits  COMPREHENSIVE METABOLIC PANEL - Abnormal; Notable for the following components:   Total Protein 8.4 (*)    ALT 48 (*)    All other components within normal limits  LIPASE, BLOOD - Abnormal; Notable for the following components:   Lipase 350 (*)    All other components within normal limits  URINALYSIS, ROUTINE W REFLEX MICROSCOPIC - Abnormal; Notable for the following components:   Ketones, ur 20 (*)    All other components within normal limits  RESP PANEL BY RT-PCR (FLU A&B, COVID) ARPGX2  ETHANOL     EKG None  Radiology CT ABDOMEN PELVIS W CONTRAST  Result Date: 04/28/2021 CLINICAL DATA:  Nausea and vomiting. EXAM: CT ABDOMEN AND PELVIS WITH CONTRAST TECHNIQUE: Multidetector CT imaging of the abdomen and pelvis was performed using the standard protocol following bolus administration of intravenous contrast. CONTRAST:  33mL OMNIPAQUE IOHEXOL 350 MG/ML SOLN COMPARISON:  CT abdomen pelvis dated 12/21/2020. FINDINGS: Lower chest: The visualized lung bases are clear. No intra-abdominal free air or free fluid. Hepatobiliary: No focal liver abnormality is seen. No gallstones, gallbladder wall thickening, or biliary dilatation. Pancreas: There is diffuse inflammatory changes of the pancreas consistent with acute pancreatitis. No drainable fluid collection/abscess or pseudocyst. Spleen: Normal in size without focal abnormality. Adrenals/Urinary Tract: The adrenal glands are unremarkable. Small focal hypodensity in the upper pole of the right kidney may represent an area of parenchymal scarring or infarct versus a small cyst. There is no hydronephrosis on either side. The the visualized ureters and urinary bladder appear unremarkable. Stomach/Bowel: No bowel obstruction or active inflammation. The appendix is normal. Vascular/Lymphatic: The abdominal aorta and IVC are unremarkable. No portal venous gas. There is no adenopathy. Reproductive: The prostate and seminal vesicles are grossly unremarkable. Other: None Musculoskeletal: No acute or significant osseous findings. IMPRESSION: 1. Acute pancreatitis. No abscess or pseudocyst. 2. No bowel obstruction. Normal appendix. Electronically Signed   By: Elgie Collard M.D.   On: 04/28/2021 02:30    Procedures Procedures    Medications Ordered in ED Medications  LORazepam (ATIVAN) injection 0-4 mg (has no administration in time range)    Or  LORazepam (ATIVAN) tablet 0-4 mg (has no administration in time range)  LORazepam (ATIVAN) injection 0-4 mg (has no  administration in time range)    Or  LORazepam (ATIVAN) tablet 0-4 mg (has no administration in time range)  thiamine tablet 100 mg (has no administration in time range)    Or  thiamine (B-1) injection 100 mg (has no administration in time range)  lactated ringers bolus 1,000 mL (0 mLs Intravenous Stopped 04/28/21 0152)  HYDROmorphone (DILAUDID) injection 1 mg (1 mg Intravenous Given 04/28/21 0117)  ondansetron (ZOFRAN) injection 4 mg (4 mg Intravenous Given 04/28/21 0120)  iohexol (OMNIPAQUE) 350 MG/ML injection 80 mL (80 mLs Intravenous Contrast Given 04/28/21 0214)   ED Course/ Medical Decision Making/ A&P  Medical Decision Making Pt is a 36 y.o. male who presents to the emergency department due to upper abdominal pain.  Patient has a history of alcohol abuse as well as pancreatitis.  He feels that his current pain is similar to prior pancreatic pain exacerbations.  Labs: CBC with a white count of 15.2, MCV of 74.8, MCH of 24, platelets of 420, neutrophils of 13.1. CMP with a total protein of 8.4 and an ALT of 48. Lipase of 350. Respiratory panel is negative. Ethanol less than 10. UA with 20 ketones.  Imaging: CT scan of the abdomen/pelvis with contrast shows acute pancreatitis.  No abscess or pseudocyst.  No bowel obstruction.  Normal appendix.  I, Placido SouLogan Daviona Herbert, PA-C, personally reviewed and evaluated these images and lab results as part of my medical decision-making.  Physical exam, lab work, as well as imaging consistent with acute pancreatitis.  Patient states he drinks about 1/2 pint of liquor every other day but last night drank a full pint of liquor.  He woke up this morning with abdominal pain as well as nausea and vomiting.  Denies any hematemesis.  Patient treated with Dilaudid, IV fluids, as well as antiemetics.  Patient notes persistent pain.  Feel that he will require admission for further management.  Given that he chronically drinks and has not  consumed any alcohol for the past 24 hours, will initiate CIWA protocol.  Discussed admission with the patient and he is amenable.  We will discuss with the medicine team.  Note: Portions of this report may have been transcribed using voice recognition software. Every effort was made to ensure accuracy; however, inadvertent computerized transcription errors may be present.   Final Clinical Impression(s) / ED Diagnoses Final diagnoses:  Acute pancreatitis, unspecified complication status, unspecified pancreatitis type   Rx / DC Orders ED Discharge Orders     None         Placido SouJoldersma, Austine Kelsay, PA-C 04/28/21 0400    Ernie AvenaLawsing, James, MD 04/28/21 [phone]

## 2021-04-29 DIAGNOSIS — K852 Alcohol induced acute pancreatitis without necrosis or infection: Secondary | ICD-10-CM | POA: Diagnosis not present

## 2021-04-29 LAB — COMPREHENSIVE METABOLIC PANEL
ALT: 27 U/L (ref 0–44)
AST: 19 U/L (ref 15–41)
Albumin: 3.8 g/dL (ref 3.5–5.0)
Alkaline Phosphatase: 44 U/L (ref 38–126)
Anion gap: 8 (ref 5–15)
BUN: 9 mg/dL (ref 6–20)
CO2: 25 mmol/L (ref 22–32)
Calcium: 8.8 mg/dL — ABNORMAL LOW (ref 8.9–10.3)
Chloride: 100 mmol/L (ref 98–111)
Creatinine, Ser: 0.94 mg/dL (ref 0.61–1.24)
GFR, Estimated: >60 mL/min (ref >60)
Glucose, Bld: 77 mg/dL (ref 70–99)
Potassium: 3.3 mmol/L — ABNORMAL LOW (ref 3.5–5.1)
Sodium: 133 mmol/L — ABNORMAL LOW (ref 135–145)
Total Bilirubin: 1.4 mg/dL — ABNORMAL HIGH (ref 0.3–1.2)
Total Protein: 6.6 g/dL (ref 6.5–8.1)

## 2021-04-29 LAB — CBC
HCT: 34.8 % — ABNORMAL LOW (ref 39.0–52.0)
Hemoglobin: 11.2 g/dL — ABNORMAL LOW (ref 13.0–17.0)
MCH: 24.2 pg — ABNORMAL LOW (ref 26.0–34.0)
MCHC: 32.2 g/dL (ref 30.0–36.0)
MCV: 75.2 fL — ABNORMAL LOW (ref 80.0–100.0)
Platelets: 295 10*3/uL (ref 150–400)
RBC: 4.63 MIL/uL (ref 4.22–5.81)
RDW: 14.3 % (ref 11.5–15.5)
WBC: 10.4 10*3/uL (ref 4.0–10.5)
nRBC: 0 % (ref 0.0–0.2)

## 2021-04-29 MED ORDER — OXYCODONE-ACETAMINOPHEN 5-325 MG PO TABS
1.0000 | ORAL_TABLET | ORAL | Status: DC | PRN
  Administered 2021-04-29: 2 via ORAL
  Administered 2021-04-29: 1 via ORAL
  Administered 2021-04-30: 2 via ORAL
  Administered 2021-04-30: 1 via ORAL
  Administered 2021-04-30: 2 via ORAL
  Filled 2021-04-29 (×2): qty 1
  Filled 2021-04-29 (×3): qty 2

## 2021-04-29 NOTE — Plan of Care (Signed)
  Problem: Clinical Measurements: Goal: Diagnostic test results will improve Outcome: Progressing   Problem: Coping: Goal: Level of anxiety will decrease Outcome: Progressing   Problem: Pain Managment: Goal: General experience of comfort will improve Outcome: Progressing   

## 2021-04-29 NOTE — Progress Notes (Signed)
PROGRESS NOTE   Angel Prince  SLH:[id number] DOB: June 23, 1985 DOA: 04/27/2021 PCP: [name], MD  Brief Narrative:   89 blk m known ETOH, BMI 30. Microcytic anemia Re-admit with abvd pain, lipase 300, WBC 15 Ct=pancreatitis  Admitted and CIWA protocol added  Hospital-Problem based course  Mild pancreatitis Continue fluid resuscitation but cut back rate to 100 cc/H Pain seems moderately controlled Change morphine to Percocet as is tolerating p.o. soft diet--- will graduate to regular diet Chronic ethanolism Seems last drink was on 1/7 He does not seem to be withdrawing so can probably discontinue Ativan a.m. if stable Elevated blood pressure without diagnosis of HTN Continue hydralazine 5 mg every 4 as needed pressure  DVT prophylaxis: Lovenox Code Status: Full Family Communication: n Disposition:  Status is: Inpatient  Remains inpatient appropriate because: Pain control and monitoring       Consultants:  None  Procedures: No  Antimicrobials:   Subjective: Slight sleepy otherwise looks well no distress  Objective: Vitals:   04/28/21 0907 04/28/21 1336 04/28/21 2110 04/29/21 0554  BP: (!) 144/86 (!) 153/82 (!) 151/82 (!) 142/65  Pulse: 60 75 (!) 57 (!) 55  Resp: 16 16 18 17   Temp: 99 F (37.2 C) (!) 97.4 F (36.3 C) 99 F (37.2 C) 98.9 F (37.2 C)  TempSrc: Oral Oral Oral Oral  SpO2: 97% 99% 98% 98%  Weight:      Height:        Intake/Output Summary (Last 24 hours) at 04/29/2021 1359 Last data filed at 04/29/2021 1100 Gross per 24 hour  Intake 3816.84 ml  Output --  Net 3816.84 ml   Filed Weights   04/27/21 2333  Weight: 86.2 kg    Examination:  EOMI NCAT no focal deficit Chest clear no rales rhonchi Abdomen nontender no rebound No lower extremity edema Psych euthymic coherent pleasant Power 5/5  Data Reviewed: personally reviewed   CBC    Component Value Date/Time   WBC 10.4 04/29/2021 0453   RBC 4.63 04/29/2021 0453   HGB  11.2 (L) 04/29/2021 0453   HCT 34.8 (L) 04/29/2021 0453   PLT 295 04/29/2021 0453   MCV 75.2 (L) 04/29/2021 0453   MCH 24.2 (L) 04/29/2021 0453   MCHC 32.2 04/29/2021 0453   RDW 14.3 04/29/2021 0453   LYMPHSABS 1.4 04/27/2021 2338   MONOABS 0.6 04/27/2021 2338   EOSABS 0.0 04/27/2021 2338   BASOSABS 0.0 04/27/2021 2338   CMP Latest Ref Rng & Units 04/29/2021 04/27/2021 12/24/2020  Glucose 70 - 99 mg/dL 77 97 79  BUN 6 - 20 mg/dL 9 15 7   Creatinine 0.61 - 1.24 mg/dL 02/23/2021 1.57  Sodium 135 - 145 mmol/L 133(L) 136 133(L)  Potassium 3.5 - 5.1 mmol/L 3.3(L) 3.9 3.5  Chloride 98 - 111 mmol/L 100 101 99  CO2 22 - 32 mmol/L 25 22 27   Calcium 8.9 - 10.3 mg/dL 2.62) 9.7 0.35)  Total Protein 6.5 - 8.1 g/dL 6.6 ) 6.2(L)  Total Bilirubin 0.3 - 1.2 mg/dL 5.9(R) 1.2 1.1  Alkaline Phos 38 - 126 U/L 44 58 44  AST 15 - 41 U/L 19 33 21  ALT 0 - 44 U/L 27 48(H) 13     Radiology Studies: CT ABDOMEN PELVIS W CONTRAST  Result Date: 04/28/2021 CLINICAL DATA:  Nausea and vomiting. EXAM: CT ABDOMEN AND PELVIS WITH CONTRAST TECHNIQUE: Multidetector CT imaging of the abdomen and pelvis was performed using the standard protocol following bolus administration of intravenous contrast. CONTRAST:  37mL OMNIPAQUE IOHEXOL 350 MG/ML SOLN COMPARISON:  CT abdomen pelvis dated 12/21/2020. FINDINGS: Lower chest: The visualized lung bases are clear. No intra-abdominal free air or free fluid. Hepatobiliary: No focal liver abnormality is seen. No gallstones, gallbladder wall thickening, or biliary dilatation. Pancreas: There is diffuse inflammatory changes of the pancreas consistent with acute pancreatitis. No drainable fluid collection/abscess or pseudocyst. Spleen: Normal in size without focal abnormality. Adrenals/Urinary Tract: The adrenal glands are unremarkable. Small focal hypodensity in the upper pole of the right kidney may represent an area of parenchymal scarring or infarct versus a small cyst. There is no  hydronephrosis on either side. The the visualized ureters and urinary bladder appear unremarkable. Stomach/Bowel: No bowel obstruction or active inflammation. The appendix is normal. Vascular/Lymphatic: The abdominal aorta and IVC are unremarkable. No portal venous gas. There is no adenopathy. Reproductive: The prostate and seminal vesicles are grossly unremarkable. Other: None Musculoskeletal: No acute or significant osseous findings. IMPRESSION: 1. Acute pancreatitis. No abscess or pseudocyst. 2. No bowel obstruction. Normal appendix. Electronically Signed   By: Elgie Collard M.D.   On: 04/28/2021 02:30     Scheduled Meds:  enoxaparin (LOVENOX) injection  40 mg Subcutaneous Q24H   folic acid  1 mg Oral Daily   multivitamin with minerals  1 tablet Oral Daily   pneumococcal 23 valent vaccine  0.5 mL Intramuscular Tomorrow-1000   thiamine  100 mg Oral Daily   Or   thiamine  100 mg Intravenous Daily   Continuous Infusions:  lactated ringers 200 mL/hr at 04/29/21 0520     LOS: 1 day   Time spent: 25  Rhetta Mura, MD [facility] To contact the attending provider between 7A-7P or the covering provider during after hours 7P-7A, please log into the web site [email or web address] and access using universal [place] password for that web site. If you do not have the password, please call the hospital operator.  04/29/2021, 1:59 PM

## 2021-04-30 DIAGNOSIS — K859 Acute pancreatitis without necrosis or infection, unspecified: Secondary | ICD-10-CM

## 2021-04-30 LAB — CBC WITH DIFFERENTIAL/PLATELET
Abs Immature Granulocytes: 0.02 10*3/uL (ref 0.00–0.07)
Basophils Absolute: 0 10*3/uL (ref 0.0–0.1)
Basophils Relative: 0 %
Eosinophils Absolute: 0.3 10*3/uL (ref 0.0–0.5)
Eosinophils Relative: 4 %
HCT: 33.1 % — ABNORMAL LOW (ref 39.0–52.0)
Hemoglobin: 10.5 g/dL — ABNORMAL LOW (ref 13.0–17.0)
Immature Granulocytes: 0 %
Lymphocytes Relative: 25 %
Lymphs Abs: 1.8 10*3/uL (ref 0.7–4.0)
MCH: 24.1 pg — ABNORMAL LOW (ref 26.0–34.0)
MCHC: 31.7 g/dL (ref 30.0–36.0)
MCV: 76.1 fL — ABNORMAL LOW (ref 80.0–100.0)
Monocytes Absolute: 0.7 10*3/uL (ref 0.1–1.0)
Monocytes Relative: 10 %
Neutro Abs: 4.4 10*3/uL (ref 1.7–7.7)
Neutrophils Relative %: 61 %
Platelets: 295 10*3/uL (ref 150–400)
RBC: 4.35 MIL/uL (ref 4.22–5.81)
RDW: 14 % (ref 11.5–15.5)
WBC: 7.4 10*3/uL (ref 4.0–10.5)
nRBC: 0 % (ref 0.0–0.2)

## 2021-04-30 LAB — COMPREHENSIVE METABOLIC PANEL
ALT: 21 U/L (ref 0–44)
AST: 17 U/L (ref 15–41)
Albumin: 3.6 g/dL (ref 3.5–5.0)
Alkaline Phosphatase: 39 U/L (ref 38–126)
Anion gap: 6 (ref 5–15)
BUN: 5 mg/dL — ABNORMAL LOW (ref 6–20)
CO2: 28 mmol/L (ref 22–32)
Calcium: 8.5 mg/dL — ABNORMAL LOW (ref 8.9–10.3)
Chloride: 101 mmol/L (ref 98–111)
Creatinine, Ser: 0.8 mg/dL (ref 0.61–1.24)
GFR, Estimated: >60 mL/min (ref >60)
Glucose, Bld: 100 mg/dL — ABNORMAL HIGH (ref 70–99)
Potassium: 3.1 mmol/L — ABNORMAL LOW (ref 3.5–5.1)
Sodium: 135 mmol/L (ref 135–145)
Total Bilirubin: 0.8 mg/dL (ref 0.3–1.2)
Total Protein: 6.6 g/dL (ref 6.5–8.1)

## 2021-04-30 LAB — MAGNESIUM: Magnesium: 1.9 mg/dL (ref 1.7–2.4)

## 2021-04-30 MED ORDER — OXYCODONE-ACETAMINOPHEN 5-325 MG PO TABS: 1.0000 | ORAL_TABLET | ORAL | 0 refills | Status: DC | PRN

## 2021-04-30 MED ORDER — POTASSIUM CHLORIDE CRYS ER 20 MEQ PO TBCR
40.0000 meq | EXTENDED_RELEASE_TABLET | ORAL | Status: AC
  Administered 2021-04-30: 40 meq via ORAL
  Filled 2021-04-30: qty 2

## 2021-04-30 NOTE — Discharge Summary (Signed)
Physician Discharge Summary  Angel Prince [id number] DOB: [birth year] DOA: 04/27/2021  PCP: Caryl Bis, MD  Admit date: 04/27/2021 Discharge date: 04/30/2021  Admitted From: Home Disposition:  Home  Recommendations for Outpatient Follow-up:  Follow up with PCP in 1-2 weeks  [place] reviewed. No recent controlled substances are listed. Limited quantity of opioid prescribed for acute pain from pancreatitis  Discharge Condition:Improved CODE STATUS:Full Diet recommendation: Regular   Brief/Interim Summary: 36 y.o. male with medical history significant of ETOH dependence presenting with abdominal pain, admitted for alcoholic pancreatitis.  Discharge Diagnoses:  Principal Problem:   Acute alcoholic pancreatitis Active Problems:   Alcohol dependence syndrome (HCC)  * Acute alcoholic pancreatitis- (present on admission) -Patient with prior h/o acute pancreatitis in 12/2020 presenting with similar symptoms -Presented with pancreatitis with mildly elevated lipase, and CT findings consistent with pancreatitis -symptoms improved with analgesia, fluids, and bowel rest -diet was successfully advanced   Alcohol dependence syndrome ([place])- (present on admission) -Patient with chronic ETOH dependence -This is his second admission for alcohol-related pancreatitis in last few months -Was continued on CIWA protocol, remained stable   Discharge Instructions   Allergies as of 04/30/2021   No Known Allergies      Medication List     TAKE these medications    oxyCODONE-acetaminophen 5-325 MG tablet Commonly known as: PERCOCET/ROXICET Take 1 tablet by mouth every 4 (four) hours as needed for moderate pain.        Follow-up Information     Caryl Bis, MD Follow up in 2 week(s).   Specialty: Family Medicine Why: Hospital follow up Contact information: [place] [address] [phone]                No Known Allergies   Procedures/Studies: CT  ABDOMEN PELVIS W CONTRAST  Result Date: 04/28/2021 CLINICAL DATA:  Nausea and vomiting. EXAM: CT ABDOMEN AND PELVIS WITH CONTRAST TECHNIQUE: Multidetector CT imaging of the abdomen and pelvis was performed using the standard protocol following bolus administration of intravenous contrast. CONTRAST:  4mL OMNIPAQUE IOHEXOL 350 MG/ML SOLN COMPARISON:  CT abdomen pelvis dated 12/21/2020. FINDINGS: Lower chest: The visualized lung bases are clear. No intra-abdominal free air or free fluid. Hepatobiliary: No focal liver abnormality is seen. No gallstones, gallbladder wall thickening, or biliary dilatation. Pancreas: There is diffuse inflammatory changes of the pancreas consistent with acute pancreatitis. No drainable fluid collection/abscess or pseudocyst. Spleen: Normal in size without focal abnormality. Adrenals/Urinary Tract: The adrenal glands are unremarkable. Small focal hypodensity in the upper pole of the right kidney may represent an area of parenchymal scarring or infarct versus a small cyst. There is no hydronephrosis on either side. The the visualized ureters and urinary bladder appear unremarkable. Stomach/Bowel: No bowel obstruction or active inflammation. The appendix is normal. Vascular/Lymphatic: The abdominal aorta and IVC are unremarkable. No portal venous gas. There is no adenopathy. Reproductive: The prostate and seminal vesicles are grossly unremarkable. Other: None Musculoskeletal: No acute or significant osseous findings. IMPRESSION: 1. Acute pancreatitis. No abscess or pseudocyst. 2. No bowel obstruction. Normal appendix. Electronically Signed   By: Anner Crete M.D.   On: 04/28/2021 02:30    Subjective: Eager to go home  Discharge Exam: Vitals:   04/29/21 2013 04/30/21 0544  BP: (!) 145/84 126/71  Pulse: (!) 56 (!) 48  Resp: 18   Temp: 98.8 F (37.1 C) 97.9 F (36.6 C)  SpO2: 98% 99%   Vitals:   04/29/21 0554 04/29/21 1451 04/29/21 2013  04/30/21 0544  BP: (!) 142/65 (!)  148/86 (!) 145/84 126/71  Pulse: (!) 55 (!) 56 (!) 56 (!) 48  Resp: 17 16 18    Temp: 98.9 F (37.2 C) 98.9 F (37.2 C) 98.8 F (37.1 C) 97.9 F (36.6 C)  TempSrc: Oral Oral Oral Oral  SpO2: 98% 99% 98% 99%  Weight:      Height:        General: Pt is alert, awake, not in acute distress Cardiovascular: RRR, S1/S2 + Respiratory: CTA bilaterally, no wheezing, no rhonchi Abdominal: Soft, NT, ND, bowel sounds + Extremities: no edema, no cyanosis   The results of significant diagnostics from this hospitalization (including imaging, microbiology, ancillary and laboratory) are listed below for reference.     Microbiology: Recent Results (from the past 240 hour(s))  Resp Panel by RT-PCR (Flu A&B, Covid) Nasopharyngeal Swab     Status: None   Collection Time: 04/28/21 12:59 AM   Specimen: Nasopharyngeal Swab; Nasopharyngeal(NP) swabs in vial transport medium  Result Value Ref Range Status   SARS Coronavirus 2 by RT PCR NEGATIVE NEGATIVE Final    Comment: (NOTE) SARS-CoV-2 target nucleic acids are NOT DETECTED.  The SARS-CoV-2 RNA is generally detectable in upper respiratory specimens during the acute phase of infection. The lowest concentration of SARS-CoV-2 viral copies this assay can detect is 138 copies/mL. A negative result does not preclude SARS-Cov-2 infection and should not be used as the sole basis for treatment or other patient management decisions. A negative result may occur with  improper specimen collection/handling, submission of specimen other than nasopharyngeal swab, presence of viral mutation(s) within the areas targeted by this assay, and inadequate number of viral copies(<138 copies/mL). A negative result must be combined with clinical observations, patient history, and epidemiological information. The expected result is Negative.  Fact Sheet for Patients:  EntrepreneurPulse.com.au  Fact Sheet for Healthcare Providers:   IncredibleEmployment.be  This test is no t yet approved or cleared by the [place] FDA and  has been authorized for detection and/or diagnosis of SARS-CoV-2 by FDA under an Emergency Use Authorization (EUA). This EUA will remain  in effect (meaning this test can be used) for the duration of the COVID-19 declaration under Section 564(b)(1) of the Act, 21 U.S.C.section 360bbb-3(b)(1), unless the authorization is terminated  or revoked sooner.       Influenza A by PCR NEGATIVE NEGATIVE Final   Influenza B by PCR NEGATIVE NEGATIVE Final    Comment: (NOTE) The Xpert Xpress SARS-CoV-2/FLU/RSV plus assay is intended as an aid in the diagnosis of influenza from Nasopharyngeal swab specimens and should not be used as a sole basis for treatment. Nasal washings and aspirates are unacceptable for Xpert Xpress SARS-CoV-2/FLU/RSV testing.  Fact Sheet for Patients: EntrepreneurPulse.com.au  Fact Sheet for Healthcare Providers: IncredibleEmployment.be  This test is not yet approved or cleared by the [place] FDA and has been authorized for detection and/or diagnosis of SARS-CoV-2 by FDA under an Emergency Use Authorization (EUA). This EUA will remain in effect (meaning this test can be used) for the duration of the COVID-19 declaration under Section 564(b)(1) of the Act, 21 U.S.C. section 360bbb-3(b)(1), unless the authorization is terminated or revoked.  Performed at [facility], [place] [address], [place], [place] [address]      Labs: BNP (last 3 results) No results for input(s): BNP in the last 8760 hours. Basic Metabolic Panel: Recent Labs  Lab 04/27/21 2338 04/29/21 0453 04/30/21 0459  NA 136 133* 135  K 3.9 3.3* 3.1*  CL 101 100 101  CO2 22 25 28   GLUCOSE 97 77 100*  BUN 15 9 5*  CREATININE 0.82 0.94 0.80  CALCIUM 9.7 8.8* 8.5*  MG  --   --  1.9   Liver Function Tests: Recent Labs  Lab  04/27/21 2338 04/29/21 0453 04/30/21 0459  AST 33 19 17  ALT 48* 27 21  ALKPHOS 58 44 39  BILITOT 1.2 1.4* 0.8  PROT 8.4* 6.6 6.6  ALBUMIN 4.8 3.8 3.6   Recent Labs  Lab 04/27/21 2338  LIPASE 350*   No results for input(s): AMMONIA in the last 168 hours. CBC: Recent Labs  Lab 04/27/21 2338 04/29/21 0453 04/30/21 0459  WBC 15.2* 10.4 7.4  NEUTROABS 13.1*  --  4.4  HGB 13.6 11.2* 10.5*  HCT 42.4 34.8* 33.1*  MCV 74.8* 75.2* 76.1*  PLT 420* 295 295   Cardiac Enzymes: No results for input(s): CKTOTAL, CKMB, CKMBINDEX, TROPONINI in the last 168 hours. BNP: Invalid input(s): POCBNP CBG: No results for input(s): GLUCAP in the last 168 hours. D-Dimer No results for input(s): DDIMER in the last 72 hours. Hgb A1c No results for input(s): HGBA1C in the last 72 hours. Lipid Profile No results for input(s): CHOL, HDL, LDLCALC, TRIG, CHOLHDL, LDLDIRECT in the last 72 hours. Thyroid function studies No results for input(s): TSH, T4TOTAL, T3FREE, THYROIDAB in the last 72 hours.  Invalid input(s): FREET3 Anemia work up No results for input(s): VITAMINB12, FOLATE, FERRITIN, TIBC, IRON, RETICCTPCT in the last 72 hours. Urinalysis    Component Value Date/Time   COLORURINE YELLOW 04/27/2021 2326   APPEARANCEUR CLEAR 04/27/2021 2326   LABSPEC 1.027 04/27/2021 2326   PHURINE 5.0 04/27/2021 2326   GLUCOSEU NEGATIVE 04/27/2021 2326   HGBUR NEGATIVE 04/27/2021 2326   BILIRUBINUR NEGATIVE 04/27/2021 2326   KETONESUR 20 (A) 04/27/2021 2326   PROTEINUR NEGATIVE 04/27/2021 2326   NITRITE NEGATIVE 04/27/2021 2326   LEUKOCYTESUR NEGATIVE 04/27/2021 2326   Sepsis Labs Invalid input(s): PROCALCITONIN,  WBC,  LACTICIDVEN Microbiology Recent Results (from the past 240 hour(s))  Resp Panel by RT-PCR (Flu A&B, Covid) Nasopharyngeal Swab     Status: None   Collection Time: 04/28/21 12:59 AM   Specimen: Nasopharyngeal Swab; Nasopharyngeal(NP) swabs in vial transport medium  Result Value  Ref Range Status   SARS Coronavirus 2 by RT PCR NEGATIVE NEGATIVE Final    Comment: (NOTE) SARS-CoV-2 target nucleic acids are NOT DETECTED.  The SARS-CoV-2 RNA is generally detectable in upper respiratory specimens during the acute phase of infection. The lowest concentration of SARS-CoV-2 viral copies this assay can detect is 138 copies/mL. A negative result does not preclude SARS-Cov-2 infection and should not be used as the sole basis for treatment or other patient management decisions. A negative result may occur with  improper specimen collection/handling, submission of specimen other than nasopharyngeal swab, presence of viral mutation(s) within the areas targeted by this assay, and inadequate number of viral copies(<138 copies/mL). A negative result must be combined with clinical observations, patient history, and epidemiological information. The expected result is Negative.  Fact Sheet for Patients:  EntrepreneurPulse.com.au  Fact Sheet for Healthcare Providers:  IncredibleEmployment.be  This test is no t yet approved or cleared by the [place] FDA and  has been authorized for detection and/or diagnosis of SARS-CoV-2 by FDA under an Emergency Use Authorization (EUA). This EUA will remain  in effect (meaning this test can be used) for the duration of the COVID-19 declaration  under Section 564(b)(1) of the Act, 21 U.S.C.section 360bbb-3(b)(1), unless the authorization is terminated  or revoked sooner.       Influenza A by PCR NEGATIVE NEGATIVE Final   Influenza B by PCR NEGATIVE NEGATIVE Final    Comment: (NOTE) The Xpert Xpress SARS-CoV-2/FLU/RSV plus assay is intended as an aid in the diagnosis of influenza from Nasopharyngeal swab specimens and should not be used as a sole basis for treatment. Nasal washings and aspirates are unacceptable for Xpert Xpress SARS-CoV-2/FLU/RSV testing.  Fact Sheet for  Patients: EntrepreneurPulse.com.au  Fact Sheet for Healthcare Providers: IncredibleEmployment.be  This test is not yet approved or cleared by the [place] FDA and has been authorized for detection and/or diagnosis of SARS-CoV-2 by FDA under an Emergency Use Authorization (EUA). This EUA will remain in effect (meaning this test can be used) for the duration of the COVID-19 declaration under Section 564(b)(1) of the Act, 21 U.S.C. section 360bbb-3(b)(1), unless the authorization is terminated or revoked.  Performed at [facility], [place] [address], [place], [place] [address]    Time spent: 30 min  SIGNED:   Marylu Lund, MD  [facility] 04/30/2021, 1:32 PM  If 7PM-7AM, please contact night-coverage

## 2022-01-17 ENCOUNTER — Other Ambulatory Visit: Payer: Self-pay

## 2022-01-17 ENCOUNTER — Encounter (HOSPITAL_COMMUNITY): Payer: Self-pay

## 2022-01-17 ENCOUNTER — Emergency Department (HOSPITAL_COMMUNITY)
Admission: EM | Admit: 2022-01-17 | Discharge: 2022-01-17 | Disposition: A | Discharge status: Home / Self Care | Payer: 59 | Attending: Emergency Medicine | Admitting: Emergency Medicine

## 2022-01-17 DIAGNOSIS — K852 Alcohol induced acute pancreatitis without necrosis or infection: Secondary | ICD-10-CM | POA: Diagnosis not present

## 2022-01-17 DIAGNOSIS — D75839 Thrombocytosis, unspecified: Secondary | ICD-10-CM | POA: Insufficient documentation

## 2022-01-17 DIAGNOSIS — R109 Unspecified abdominal pain: Secondary | ICD-10-CM | POA: Diagnosis present

## 2022-01-17 LAB — CBC
HCT: 41.6 % (ref 39.0–52.0)
Hemoglobin: 13 g/dL (ref 13.0–17.0)
MCH: 23.2 pg — ABNORMAL LOW (ref 26.0–34.0)
MCHC: 31.3 g/dL (ref 30.0–36.0)
MCV: 74.3 fL — ABNORMAL LOW (ref 80.0–100.0)
Platelets: 406 10*3/uL — ABNORMAL HIGH (ref 150–400)
RBC: 5.6 MIL/uL (ref 4.22–5.81)
RDW: 15.6 % — ABNORMAL HIGH (ref 11.5–15.5)
WBC: 14.8 10*3/uL — ABNORMAL HIGH (ref 4.0–10.5)
nRBC: 0 % (ref 0.0–0.2)

## 2022-01-17 LAB — COMPREHENSIVE METABOLIC PANEL
ALT: 31 U/L (ref 0–44)
AST: 29 U/L (ref 15–41)
Albumin: 4.7 g/dL (ref 3.5–5.0)
Alkaline Phosphatase: 65 U/L (ref 38–126)
Anion gap: 10 (ref 5–15)
BUN: 12 mg/dL (ref 6–20)
CO2: 24 mmol/L (ref 22–32)
Calcium: 9.4 mg/dL (ref 8.9–10.3)
Chloride: 108 mmol/L (ref 98–111)
Creatinine, Ser: 1 mg/dL (ref 0.61–1.24)
GFR, Estimated: >60 mL/min (ref >60)
Glucose, Bld: 105 mg/dL — ABNORMAL HIGH (ref 70–99)
Potassium: 4 mmol/L (ref 3.5–5.1)
Sodium: 142 mmol/L (ref 135–145)
Total Bilirubin: 0.7 mg/dL (ref 0.3–1.2)
Total Protein: 7.8 g/dL (ref 6.5–8.1)

## 2022-01-17 LAB — URINALYSIS, ROUTINE W REFLEX MICROSCOPIC
Bacteria, UA: NONE SEEN
Bilirubin Urine: NEGATIVE
Glucose, UA: NEGATIVE mg/dL
Ketones, ur: 5 mg/dL — AB
Leukocytes,Ua: NEGATIVE
Nitrite: NEGATIVE
Protein, ur: NEGATIVE mg/dL
Specific Gravity, Urine: 1.024 (ref 1.005–1.030)
pH: 5 (ref 5.0–8.0)

## 2022-01-17 LAB — LIPASE, BLOOD: Lipase: 182 U/L — ABNORMAL HIGH (ref 11–51)

## 2022-01-17 MED ORDER — ONDANSETRON 8 MG PO TBDP: 8.0000 mg | ORAL_TABLET | Freq: Three times a day (TID) | ORAL | 0 refills | Status: DC | PRN

## 2022-01-17 MED ORDER — MORPHINE SULFATE (PF) 4 MG/ML IV SOLN
4.0000 mg | Freq: Once | INTRAVENOUS | Status: AC
  Administered 2022-01-17: 4 mg via INTRAVENOUS
  Filled 2022-01-17: qty 1

## 2022-01-17 MED ORDER — LACTATED RINGERS IV BOLUS
1000.0000 mL | Freq: Once | INTRAVENOUS | Status: AC
  Administered 2022-01-17: 1000 mL via INTRAVENOUS

## 2022-01-17 MED ORDER — OXYCODONE-ACETAMINOPHEN 5-325 MG PO TABS: 1.0000 | ORAL_TABLET | ORAL | 0 refills | Status: DC | PRN

## 2022-01-17 MED ORDER — ONDANSETRON HCL 4 MG/2ML IJ SOLN
4.0000 mg | Freq: Once | INTRAMUSCULAR | Status: AC
  Administered 2022-01-17: 4 mg via INTRAVENOUS
  Filled 2022-01-17: qty 2

## 2022-01-17 NOTE — ED Provider Notes (Signed)
[facility] Note   CSN: [id number] Arrival date & time: 01/17/22  6270     History  Chief Complaint  Patient presents with   Abdominal Pain    Angel Prince is a 36 y.o. male.  The history is provided by the patient.  Abdominal Pain He has history of alcoholic pancreatitis and comes in because of left mid abdominal pain radiating to the back which started tonight.  There has been associated nausea and vomiting.  Pain is similar to what he had with his prior episodes of pancreatitis.  He does admit to consuming alcohol 2 days ago.   Home Medications Prior to Admission medications   Medication Sig Start Date End Date Taking? Authorizing Provider  oxyCODONE-acetaminophen (PERCOCET/ROXICET) 5-325 MG tablet Take 1 tablet by mouth every 4 (four) hours as needed for moderate pain. 04/30/21   Jerald Kief, MD      Allergies    Patient has no known allergies.    Review of Systems   Review of Systems  Gastrointestinal:  Positive for abdominal pain.  All other systems reviewed and are negative.   Physical Exam Updated Vital Signs BP (!) 156/103   Pulse 97   Temp 99.3 F (37.4 C)   Resp 17   Ht 5\' 6"  (1.676 m)   Wt 95.3 kg   SpO2 100%   BMI 33.89 kg/m  Physical Exam Vitals and nursing note reviewed.   36 year old male, appears uncomfortable, but is in no acute distress. Vital signs are significant for elevated blood pressure. Oxygen saturation is 100%, which is normal. Head is normocephalic and atraumatic. PERRLA, EOMI. Oropharynx is clear. Neck is nontender and supple without adenopathy or JVD. Back is nontender and there is no CVA tenderness. Lungs are clear without rales, wheezes, or rhonchi. Chest is nontender. Heart has regular rate and rhythm without murmur. Abdomen is soft, flat, with moderate epigastric and left mid abdominal tenderness.  There is no rebound or guarding.. Extremities have no cyanosis or edema, full  range of motion is present. Skin is warm and dry without rash. Neurologic: Mental status is normal, cranial nerves are intact, moves all extremities equally.  ED Results / Procedures / Treatments   Labs (all labs ordered are listed, but only abnormal results are displayed) Labs Reviewed  LIPASE, BLOOD - Abnormal; Notable for the following components:      Result Value   Lipase 182 (*)    All other components within normal limits  COMPREHENSIVE METABOLIC PANEL - Abnormal; Notable for the following components:   Glucose, Bld 105 (*)    All other components within normal limits  CBC - Abnormal; Notable for the following components:   WBC 14.8 (*)    MCV 74.3 (*)    MCH 23.2 (*)    RDW 15.6 (*)    Platelets 406 (*)    All other components within normal limits  URINALYSIS, ROUTINE W REFLEX MICROSCOPIC - Abnormal; Notable for the following components:   Hgb urine dipstick SMALL (*)    Ketones, ur 5 (*)    All other components within normal limits   Procedures Procedures    Medications Ordered in ED Medications  lactated ringers bolus 1,000 mL (has no administration in time range)  ondansetron (ZOFRAN) injection 4 mg (has no administration in time range)    ED Course/ Medical Decision Making/ A&P  Medical Decision Making Amount and/or Complexity of Data Reviewed Labs: ordered.  Risk Prescription drug management.   Abdominal pain likely recurrence of alcoholic pancreatitis.  Differential diagnosis does include diverticulitis, peptic ulcer disease, gastritis, urinary tract infection, urolithiasis.  I have reviewed and interpreted his laboratory tests, my interpretation is elevated lipase consistent with acute pancreatitis, leukocytosis which is nonspecific, mild thrombocytosis which is likely reactive.  With benign exam and history of pancreatitis and elevated lipase, I do not feel there is any indication for abdominal imaging.  Old records are  reviewed, and he had been admitted to the hospital for alcoholic pancreatitis on 06/22/4560 and 04/27/2021.  I have ordered IV fluids, morphine, ondansetron.  He had good pain relief with above-noted treatment, but did note that pain started to recur.  He is given a second dose of morphine.  He still wishes to try to manage his condition at home.  I have sent prescriptions for oxycodone-acetaminophen and ondansetron oral dissolving tablet.  He is admonished to never consume alcohol again.  Return to the emergency department if symptoms or not being adequately controlled at home.  Final Clinical Impression(s) / ED Diagnoses Final diagnoses:  Alcohol-induced acute pancreatitis without infection or necrosis  Thrombocytosis    Rx / DC Orders ED Discharge Orders          Ordered    oxyCODONE-acetaminophen (PERCOCET/ROXICET) 5-325 MG tablet  Every 4 hours PRN        01/17/22 0722    ondansetron (ZOFRAN-ODT) 8 MG disintegrating tablet  Every 8 hours PRN        01/17/22 5638              Delora Fuel, MD 93/73/42 [phone]

## 2022-01-17 NOTE — ED Triage Notes (Signed)
Ambulatory to ED with c/o abd pain, nausea, and vomiting x 3 hours PTA. Hx of pancreatitis.

## 2022-01-17 NOTE — Discharge Instructions (Signed)
Your pancreas is inflamed because you had some alcohol.  Whenever you have alcohol, this is likely to happen again.  Please make sure that you are keeping yourself adequately hydrated.  Keep your diet limited to things that are easy to digest until pain is improving.  If it anytime pain is not being adequately controlled at home, return to the emergency department.

## 2022-01-17 NOTE — ED Notes (Signed)
MD at the bedside  

## 2022-01-17 NOTE — ED Notes (Signed)
Patient Alert and oriented to baseline. Stable and ambulatory to baseline. Patient verbalized understanding of the discharge instructions.  Patient belongings were taken by the patient.   

## 2022-11-05 ENCOUNTER — Other Ambulatory Visit: Payer: Self-pay

## 2022-11-05 ENCOUNTER — Emergency Department (HOSPITAL_COMMUNITY): Payer: 59

## 2022-11-05 ENCOUNTER — Observation Stay (HOSPITAL_COMMUNITY)
Admission: EM | Admit: 2022-11-05 | Discharge: 2022-11-06 | Disposition: A | Discharge status: Home / Self Care | Payer: 59 | Attending: Family Medicine | Admitting: Family Medicine

## 2022-11-05 ENCOUNTER — Encounter (HOSPITAL_COMMUNITY): Payer: Self-pay | Admitting: *Deleted

## 2022-11-05 DIAGNOSIS — K852 Alcohol induced acute pancreatitis without necrosis or infection: Secondary | ICD-10-CM | POA: Diagnosis not present

## 2022-11-05 DIAGNOSIS — K859 Acute pancreatitis without necrosis or infection, unspecified: Principal | ICD-10-CM

## 2022-11-05 DIAGNOSIS — R109 Unspecified abdominal pain: Secondary | ICD-10-CM | POA: Diagnosis present

## 2022-11-05 DIAGNOSIS — F1020 Alcohol dependence, uncomplicated: Secondary | ICD-10-CM | POA: Diagnosis present

## 2022-11-05 DIAGNOSIS — Z79899 Other long term (current) drug therapy: Secondary | ICD-10-CM | POA: Diagnosis not present

## 2022-11-05 LAB — COMPREHENSIVE METABOLIC PANEL
ALT: 34 U/L (ref 0–44)
AST: 29 U/L (ref 15–41)
Albumin: 4.6 g/dL (ref 3.5–5.0)
Alkaline Phosphatase: 64 U/L (ref 38–126)
Anion gap: 8 (ref 5–15)
BUN: 10 mg/dL (ref 6–20)
CO2: 21 mmol/L — ABNORMAL LOW (ref 22–32)
Calcium: 9.3 mg/dL (ref 8.9–10.3)
Chloride: 108 mmol/L (ref 98–111)
Creatinine, Ser: 1.07 mg/dL (ref 0.61–1.24)
GFR, Estimated: >60 mL/min (ref >60)
Glucose, Bld: 114 mg/dL — ABNORMAL HIGH (ref 70–99)
Potassium: 3.6 mmol/L (ref 3.5–5.1)
Sodium: 137 mmol/L (ref 135–145)
Total Bilirubin: 0.9 mg/dL (ref 0.3–1.2)
Total Protein: 8.1 g/dL (ref 6.5–8.1)

## 2022-11-05 LAB — CBC
HCT: 41.5 % (ref 39.0–52.0)
Hemoglobin: 13.1 g/dL (ref 13.0–17.0)
MCH: 23.3 pg — ABNORMAL LOW (ref 26.0–34.0)
MCHC: 31.6 g/dL (ref 30.0–36.0)
MCV: 73.8 fL — ABNORMAL LOW (ref 80.0–100.0)
Platelets: 388 10*3/uL (ref 150–400)
RBC: 5.62 MIL/uL (ref 4.22–5.81)
RDW: 14.5 % (ref 11.5–15.5)
WBC: 14.6 10*3/uL — ABNORMAL HIGH (ref 4.0–10.5)
nRBC: 0 % (ref 0.0–0.2)

## 2022-11-05 LAB — URINALYSIS, ROUTINE W REFLEX MICROSCOPIC
Bacteria, UA: NONE SEEN
Bilirubin Urine: NEGATIVE
Glucose, UA: NEGATIVE mg/dL
Ketones, ur: 80 mg/dL — AB
Leukocytes,Ua: NEGATIVE
Nitrite: NEGATIVE
Protein, ur: 100 mg/dL — AB
Specific Gravity, Urine: 1.027 (ref 1.005–1.030)
pH: 5 (ref 5.0–8.0)

## 2022-11-05 LAB — LIPASE, BLOOD: Lipase: 369 U/L — ABNORMAL HIGH (ref 11–51)

## 2022-11-05 LAB — ETHANOL: Alcohol, Ethyl (B): <10 mg/dL (ref <10)

## 2022-11-05 MED ORDER — HYDROMORPHONE HCL 1 MG/ML IJ SOLN
1.0000 mg | Freq: Once | INTRAMUSCULAR | Status: AC
  Administered 2022-11-05: 1 mg via INTRAVENOUS
  Filled 2022-11-05: qty 1

## 2022-11-05 MED ORDER — SODIUM CHLORIDE 0.9% FLUSH: 3.0000 mL | INTRAVENOUS | Status: DC | PRN

## 2022-11-05 MED ORDER — ONDANSETRON 4 MG PO TBDP
ORAL_TABLET | ORAL | Status: AC
  Administered 2022-11-05: 4 mg via ORAL
  Filled 2022-11-05: qty 1

## 2022-11-05 MED ORDER — SODIUM CHLORIDE 0.9 % IV BOLUS
1000.0000 mL | Freq: Once | INTRAVENOUS | Status: AC
  Administered 2022-11-05: 1000 mL via INTRAVENOUS

## 2022-11-05 MED ORDER — SODIUM CHLORIDE 0.9% FLUSH
3.0000 mL | Freq: Two times a day (BID) | INTRAVENOUS | Status: DC
  Administered 2022-11-05 – 2022-11-06 (×2): 3 mL via INTRAVENOUS

## 2022-11-05 MED ORDER — PANTOPRAZOLE SODIUM 40 MG IV SOLR
40.0000 mg | Freq: Once | INTRAVENOUS | Status: AC
  Administered 2022-11-05: 40 mg via INTRAVENOUS
  Filled 2022-11-05: qty 10

## 2022-11-05 MED ORDER — SODIUM CHLORIDE 0.9 % IV SOLN: 250.0000 mL | INTRAVENOUS | Status: DC | PRN

## 2022-11-05 MED ORDER — HYDROMORPHONE HCL 1 MG/ML IJ SOLN
1.0000 mg | INTRAMUSCULAR | Status: DC | PRN
  Administered 2022-11-05 – 2022-11-06 (×2): 1 mg via INTRAVENOUS
  Filled 2022-11-05 (×3): qty 1

## 2022-11-05 MED ORDER — ENOXAPARIN SODIUM 40 MG/0.4ML IJ SOSY: 40.0000 mg | PREFILLED_SYRINGE | INTRAMUSCULAR | Status: DC

## 2022-11-05 MED ORDER — IOHEXOL 300 MG/ML  SOLN
100.0000 mL | Freq: Once | INTRAMUSCULAR | Status: AC | PRN
  Administered 2022-11-05: 100 mL via INTRAVENOUS

## 2022-11-05 MED ORDER — ONDANSETRON HCL 4 MG/2ML IJ SOLN
4.0000 mg | Freq: Once | INTRAMUSCULAR | Status: AC
  Administered 2022-11-05: 4 mg via INTRAVENOUS
  Filled 2022-11-05: qty 2

## 2022-11-05 MED ORDER — ONDANSETRON 4 MG PO TBDP: 4.0000 mg | ORAL_TABLET | Freq: Once | ORAL | Status: AC

## 2022-11-05 MED ORDER — ONDANSETRON 4 MG PO TBDP: 8.0000 mg | ORAL_TABLET | Freq: Three times a day (TID) | ORAL | Status: DC | PRN

## 2022-11-05 MED ORDER — TRAZODONE HCL 50 MG PO TABS: 25.0000 mg | ORAL_TABLET | Freq: Every evening | ORAL | Status: DC | PRN

## 2022-11-05 NOTE — ED Notes (Signed)
Pt given water at this time 

## 2022-11-05 NOTE — ED Notes (Signed)
ED TO INPATIENT HANDOFF REPORT  ED Nurse Name and Phone #: Jennette Kettle, RN [phone]  S Name/Age/Gender Angel Prince 37 y.o. male Room/Bed: APA18/APA18  Code Status   Code Status: Full Code  Home/SNF/Other Home Patient oriented to: self, place, time, and situation Is this baseline? Yes   Triage Complete: Triage complete  Chief Complaint Acute pancreatitis [K85.90]  Triage Note Pt BIB [facility] for left lower abd pain for past 3 days.  Emesis this morning, denies any diarrhea. Pt admits to drinking ETOH 3 days ago.    Allergies No Known Allergies  Level of Care/Admitting Diagnosis ED Disposition     ED Disposition  Admit   Condition  --   Comment  Hospital Area: [facility] [[id number]]  Level of Care: Med-Surg [16]  Covid Evaluation: Asymptomatic - no recent exposure (last 10 days) testing not required  Diagnosis: Acute pancreatitis [577.0.ICD-9-CM]  Admitting Physician: Jacques Navy [[id number]]  Attending Physician: Jacques Navy [[id number]]          B Medical/Surgery History Past Medical History:  Diagnosis Date   Alcohol dependence (HCC)    Alcoholic pancreatitis    History reviewed. No pertinent surgical history.   A IV Location/Drains/Wounds Patient Lines/Drains/Airways Status     Active Line/Drains/Airways     Name Placement date Placement time Site Days   Peripheral IV 11/05/22 20 G 1" Right;Lateral Forearm 11/05/22  1408  Forearm  less than 1            Intake/Output Last 24 hours  Intake/Output Summary (Last 24 hours) at 11/05/2022 1831 Last data filed at 11/05/2022 1454 Gross per 24 hour  Intake 1000 ml  Output --  Net 1000 ml    Labs/Imaging Results for orders placed or performed during the hospital encounter of 11/05/22 (from the past 48 hour(s))  Urinalysis, Routine w reflex microscopic -Urine, Clean Catch     Status: Abnormal   Collection Time: 11/05/22 12:19 PM  Result Value Ref Range   Color, Urine YELLOW YELLOW    APPearance CLEAR CLEAR   Specific Gravity, Urine 1.027 1.005 - 1.030   pH 5.0 5.0 - 8.0   Glucose, UA NEGATIVE NEGATIVE mg/dL   Hgb urine dipstick SMALL (A) NEGATIVE   Bilirubin Urine NEGATIVE NEGATIVE   Ketones, ur 80 (A) NEGATIVE mg/dL   Protein, ur 454 (A) NEGATIVE mg/dL   Nitrite NEGATIVE NEGATIVE   Leukocytes,Ua NEGATIVE NEGATIVE   RBC / HPF 0-5 0 - 5 RBC/hpf   WBC, UA 0-5 0 - 5 WBC/hpf   Bacteria, UA NONE SEEN NONE SEEN   Squamous Epithelial / HPF 0-5 0 - 5 /HPF   Mucus PRESENT     Comment: Performed at [facility], [address] [place], [place] [address]  Lipase, blood     Status: Abnormal   Collection Time: 11/05/22 12:39 PM  Result Value Ref Range   Lipase 369 (H) 11 - 51 U/L    Comment: Performed at [facility], [address], [place], [place] [address]  Comprehensive metabolic panel     Status: Abnormal   Collection Time: 11/05/22 12:39 PM  Result Value Ref Range   Sodium 137 135 - 145 mmol/L   Potassium 3.6 3.5 - 5.1 mmol/L   Chloride 108 98 - 111 mmol/L   CO2 21 (L) 22 - 32 mmol/L   Glucose, Bld 114 (H) 70 - 99 mg/dL    Comment: Glucose reference range applies only to samples taken after fasting for at  least 8 hours.   BUN 10 6 - 20 mg/dL   Creatinine, Ser 1.61 0.61 - 1.24 mg/dL   Calcium 9.3 8.9 - 09.6 mg/dL   Total Protein 8.1 6.5 - 8.1 g/dL   Albumin 4.6 3.5 - 5.0 g/dL   AST 29 15 - 41 U/L   ALT 34 0 - 44 U/L   Alkaline Phosphatase 64 38 - 126 U/L   Total Bilirubin 0.9 0.3 - 1.2 mg/dL   GFR, Estimated >04 >54 mL/min    Comment: (NOTE) Calculated using the CKD-EPI Creatinine Equation (2021)    Anion gap 8 5 - 15    Comment: Performed at [facility], [address], [place], [place] [address]  CBC     Status: Abnormal   Collection Time: 11/05/22 12:39 PM  Result Value Ref Range   WBC 14.6 (H) 4.0 - 10.5 K/uL   RBC 5.62 4.22 - 5.81 MIL/uL   Hemoglobin 13.1 13.0 - 17.0 g/dL   HCT 91.4 78.2 - 95.6 %   MCV 73.8 (L) 80.0 - 100.0 fL   MCH 23.3 (L) 26.0  - 34.0 pg   MCHC 31.6 30.0 - 36.0 g/dL   RDW 21.3 08.6 - 57.8 %   Platelets 388 150 - 400 K/uL   nRBC 0.0 0.0 - 0.2 %    Comment: Performed at [facility], [address], [place], [place] [address]  Ethanol     Status: None   Collection Time: 11/05/22  2:16 PM  Result Value Ref Range   Alcohol, Ethyl (B) <10 <10 mg/dL    Comment: (NOTE) Lowest detectable limit for serum alcohol is 10 mg/dL.  For medical purposes only. Performed at [facility], [address], [place], [place] [address]    CT ABDOMEN PELVIS W CONTRAST  Result Date: 11/05/2022 CLINICAL DATA:  Acute nonlocalized abdominal pain. Left lower abdominal pain for 3 days. Vomiting today. EXAM: CT ABDOMEN AND PELVIS WITH CONTRAST TECHNIQUE: Multidetector CT imaging of the abdomen and pelvis was performed using the standard protocol following bolus administration of intravenous contrast. RADIATION DOSE REDUCTION: This exam was performed according to the departmental dose-optimization program which includes automated exposure control, adjustment of the mA and/or kV according to patient size and/or use of iterative reconstruction technique. CONTRAST:  OMNIPAQUE IOHEXOL 300 MG/ML  SOLN COMPARISON:  04/28/2021 FINDINGS: Lower chest: Lung bases are clear. Hepatobiliary: No focal liver abnormality is seen. No gallstones, gallbladder wall thickening, or biliary dilatation. Pancreas: Peripancreatic infiltration and edema with small collection inferior to the body of the pancreas measuring 1.5 cm diameter. Changes are consistent with acute pancreatitis. No ductal dilatation or pancreatic necrosis. Spleen: Normal in size without focal abnormality. Adrenals/Urinary Tract: Adrenal glands are unremarkable. Kidneys are normal, without renal calculi, focal lesion, or hydronephrosis. Bladder is unremarkable. Stomach/Bowel: Stomach is within normal limits. Appendix appears normal. No evidence of bowel wall thickening, distention, or inflammatory  changes. Vascular/Lymphatic: No significant vascular findings are present. No enlarged abdominal or pelvic lymph nodes. Reproductive: Prostate is unremarkable. Other: No abdominal wall hernia or abnormality. No abdominopelvic ascites. Musculoskeletal: No acute or significant osseous findings. IMPRESSION: 1. Diffuse peripancreatic stranding and edema with small loculated collection consistent with acute pancreatitis. No pancreatic necrosis identified. 2. Otherwise, no acute changes identified. Electronically Signed   By: Burman Nieves M.D.   On: 11/05/2022 15:34    Pending Labs Unresulted Labs (From admission, onward)     Start     Ordered   11/12/22 0500  Creatinine, serum  (enoxaparin (LOVENOX)    CrCl >/= 30 ml/min)  Weekly,   R     Comments: while on enoxaparin therapy    11/05/22 1757   11/06/22 0500  Lipase, blood  Tomorrow morning,   R        11/05/22 1757            Vitals/Pain Today's Vitals   11/05/22 1610 11/05/22 1610 11/05/22 1711 11/05/22 1805  BP:      Pulse:      Resp:      Temp: 98.5 F (36.9 C)     TempSrc: Oral     SpO2:      Weight:      Height:      PainSc:  1  6  1      Isolation Precautions No active isolations  Medications Medications  ondansetron (ZOFRAN-ODT) disintegrating tablet 8 mg (has no administration in time range)  enoxaparin (LOVENOX) injection 40 mg (has no administration in time range)  sodium chloride flush (NS) 0.9 % injection 3 mL (has no administration in time range)  sodium chloride flush (NS) 0.9 % injection 3 mL (has no administration in time range)  0.9 %  sodium chloride infusion (has no administration in time range)  HYDROmorphone (DILAUDID) injection 1 mg (has no administration in time range)  traZODone (DESYREL) tablet 25 mg (has no administration in time range)  ondansetron (ZOFRAN-ODT) disintegrating tablet 4 mg (4 mg Oral Given 11/05/22 1340)  sodium chloride 0.9 % bolus 1,000 mL (0 mLs Intravenous Stopped 11/05/22  1454)  HYDROmorphone (DILAUDID) injection 1 mg (1 mg Intravenous Given 11/05/22 1415)  ondansetron (ZOFRAN) injection 4 mg (4 mg Intravenous Given 11/05/22 1414)  pantoprazole (PROTONIX) injection 40 mg (40 mg Intravenous Given 11/05/22 1414)  iohexol (OMNIPAQUE) 300 MG/ML solution 100 mL (100 mLs Intravenous Contrast Given 11/05/22 1518)  HYDROmorphone (DILAUDID) injection 1 mg (1 mg Intravenous Given 11/05/22 1709)    Mobility walks     Focused Assessments Abdominal pain   R Recommendations: See Admitting Provider Note  Report given to:   Additional Notes: ambulatory, A & O x4, last drink 3 days ago last pain medication 1 mg dilaudid given around 1730

## 2022-11-05 NOTE — ED Provider Notes (Signed)
EMERGENCY DEPARTMENT AT [facility] Provider Note   CSN: [id number] Arrival date & time: 11/05/22  1209     History {Add pertinent medical, surgical, social history, OB history to HPI:1} Chief Complaint  Patient presents with   Abdominal Pain    Angel Prince is a 37 y.o. male.  Patient with history of pancreatitis.  Patient has been drinking alcohol again and now complains of vomiting and abdominal pain   Abdominal Pain      Home Medications Prior to Admission medications   Medication Sig Start Date End Date Taking? Authorizing Provider  Aspirin-Salicylamide-Caffeine (BC HEADACHE PO) Take 1 packet by mouth daily as needed (pain).    [provider]  ondansetron (ZOFRAN-ODT) 8 MG disintegrating tablet Take 1 tablet (8 mg total) by mouth every 8 (eight) hours as needed for nausea or vomiting. 01/17/22   Dione Booze, MD  oxyCODONE-acetaminophen (PERCOCET/ROXICET) 5-325 MG tablet Take 1 tablet by mouth every 4 (four) hours as needed for moderate pain. 01/17/22   Dione Booze, MD      Allergies    Patient has no known allergies.    Review of Systems   Review of Systems  Gastrointestinal:  Positive for abdominal pain.    Physical Exam Updated Vital Signs BP (!) 141/85   Pulse (!) 59   Temp 99.7 F (37.6 C) (Oral)   Resp 16   Ht 5\' 9"  (1.753 m)   Wt 90.7 kg   SpO2 97%   BMI 29.53 kg/m  Physical Exam  ED Results / Procedures / Treatments   Labs (all labs ordered are listed, but only abnormal results are displayed) Labs Reviewed  LIPASE, BLOOD - Abnormal; Notable for the following components:      Result Value   Lipase 369 (*)    All other components within normal limits  COMPREHENSIVE METABOLIC PANEL - Abnormal; Notable for the following components:   CO2 21 (*)    Glucose, Bld 114 (*)    All other components within normal limits  CBC - Abnormal; Notable for the following components:   WBC 14.6 (*)    MCV 73.8 (*)    MCH  23.3 (*)    All other components within normal limits  URINALYSIS, ROUTINE W REFLEX MICROSCOPIC - Abnormal; Notable for the following components:   Hgb urine dipstick SMALL (*)    Ketones, ur 80 (*)    Protein, ur 100 (*)    All other components within normal limits  ETHANOL    EKG None  Radiology CT ABDOMEN PELVIS W CONTRAST  Result Date: 11/05/2022 CLINICAL DATA:  Acute nonlocalized abdominal pain. Left lower abdominal pain for 3 days. Vomiting today. EXAM: CT ABDOMEN AND PELVIS WITH CONTRAST TECHNIQUE: Multidetector CT imaging of the abdomen and pelvis was performed using the standard protocol following bolus administration of intravenous contrast. RADIATION DOSE REDUCTION: This exam was performed according to the departmental dose-optimization program which includes automated exposure control, adjustment of the mA and/or kV according to patient size and/or use of iterative reconstruction technique. CONTRAST:  OMNIPAQUE IOHEXOL 300 MG/ML  SOLN COMPARISON:  04/28/2021 FINDINGS: Lower chest: Lung bases are clear. Hepatobiliary: No focal liver abnormality is seen. No gallstones, gallbladder wall thickening, or biliary dilatation. Pancreas: Peripancreatic infiltration and edema with small collection inferior to the body of the pancreas measuring 1.5 cm diameter. Changes are consistent with acute pancreatitis. No ductal dilatation or pancreatic necrosis. Spleen: Normal in size without focal abnormality. Adrenals/Urinary Tract: Adrenal  glands are unremarkable. Kidneys are normal, without renal calculi, focal lesion, or hydronephrosis. Bladder is unremarkable. Stomach/Bowel: Stomach is within normal limits. Appendix appears normal. No evidence of bowel wall thickening, distention, or inflammatory changes. Vascular/Lymphatic: No significant vascular findings are present. No enlarged abdominal or pelvic lymph nodes. Reproductive: Prostate is unremarkable. Other: No abdominal wall hernia or  abnormality. No abdominopelvic ascites. Musculoskeletal: No acute or significant osseous findings. IMPRESSION: 1. Diffuse peripancreatic stranding and edema with small loculated collection consistent with acute pancreatitis. No pancreatic necrosis identified. 2. Otherwise, no acute changes identified. Electronically Signed   By: Burman Nieves M.D.   On: 11/05/2022 15:34    Procedures Procedures  {Document cardiac monitor, telemetry assessment procedure when appropriate:1}  Medications Ordered in ED Medications  ondansetron (ZOFRAN-ODT) disintegrating tablet 4 mg (4 mg Oral Given 11/05/22 1340)  sodium chloride 0.9 % bolus 1,000 mL (0 mLs Intravenous Stopped 11/05/22 1454)  HYDROmorphone (DILAUDID) injection 1 mg (1 mg Intravenous Given 11/05/22 1415)  ondansetron (ZOFRAN) injection 4 mg (4 mg Intravenous Given 11/05/22 1414)  pantoprazole (PROTONIX) injection 40 mg (40 mg Intravenous Given 11/05/22 1414)  iohexol (OMNIPAQUE) 300 MG/ML solution 100 mL (100 mLs Intravenous Contrast Given 11/05/22 1518)    ED Course/ Medical Decision Making/ A&P   {   Click here for ABCD2, HEART and other calculatorsREFRESH Note before signing :1}                          Medical Decision Making Amount and/or Complexity of Data Reviewed Labs: ordered. Radiology: ordered.  Risk Prescription drug management. Decision regarding hospitalization.   Patient with pancreatitis secondary to alcohol abuse.  He will be admitted to medicine  {Document critical care time when appropriate:1} {Document review of labs and clinical decision tools ie heart score, Chads2Vasc2 etc:1}  {Document your independent review of radiology images, and any outside records:1} {Document your discussion with family members, caretakers, and with consultants:1} {Document social determinants of health affecting pt's care:1} {Document your decision making why or why not admission, treatments were needed:1} Final Clinical Impression(s) /  ED Diagnoses Final diagnoses:  Acute pancreatitis without infection or necrosis, unspecified pancreatitis type    Rx / DC Orders ED Discharge Orders     None

## 2022-11-05 NOTE — H&P (Signed)
History and Physical    [name]:[id number] DOB: 05-Jan-1986 DOA: 11/05/2022  DOS: the patient was seen and examined on 11/05/2022  PCP: [name], MD   Patient coming from: Home  I have personally briefly reviewed patient's old medical records in [facility]  [name], a 37 y/o, drinks on a regular basis. He has had 3 prior bouts of pancreatitis. His last drink was 72 hours ago. He developed progressive upper abdominal pain along with N/V. These symptoms were familiar to him from previous episodes of pancreatitis. He presents to [facility] for evaluation.    ED Course: T 98.5  141/85  HR 59  RR 16. Patient at time of admission was in no distress. Lab: glucose 114, lipase 369, WBC 14.6. CT reveals peripancreatic strandings c/w acute pancreatitis. In ED patient received IV fluids and Dilaudid for pain control. [facility] called to admit for continued treatment of acute pancreatitis  Review of Systems:  Review of Systems  Constitutional:  Negative for chills, fever and weight loss.  HENT: Negative.    Eyes: Negative.   Respiratory: Negative.    Cardiovascular: Negative.   Gastrointestinal:  Positive for abdominal pain, heartburn, nausea and vomiting.  Genitourinary: Negative.   Musculoskeletal: Negative.   Skin: Negative.   Neurological: Negative.   Endo/Heme/Allergies: Negative.   Psychiatric/Behavioral: Negative.      Past Medical History:  Diagnosis Date   Alcohol dependence (HCC)    Alcoholic pancreatitis     History reviewed. No pertinent surgical history.   Soc Hx -  engaged to marry. Works as a Financial planner for [employer]    reports that he has never smoked. He has never used smokeless tobacco. He reports current alcohol use. He reports that he does not currently use drugs.  No Known Allergies  History reviewed. No pertinent family history.  Prior to Admission medications   Medication Sig Start Date End Date Taking? Authorizing Provider   Aspirin-Salicylamide-Caffeine (BC HEADACHE PO) Take 1 packet by mouth daily as needed (pain).    [provider]  ondansetron (ZOFRAN-ODT) 8 MG disintegrating tablet Take 1 tablet (8 mg total) by mouth every 8 (eight) hours as needed for nausea or vomiting. 01/17/22   Dione Booze, MD  oxyCODONE-acetaminophen (PERCOCET/ROXICET) 5-325 MG tablet Take 1 tablet by mouth every 4 (four) hours as needed for moderate pain. 01/17/22   Dione Booze, MD    Physical Exam: Vitals:   11/05/22 1215 11/05/22 1218 11/05/22 1500 11/05/22 1610  BP: (!) 152/91  (!) 141/85   Pulse: 87  (!) 59   Resp: (!) 22  16   Temp: 99.7 F (37.6 C)   98.5 F (36.9 C)  TempSrc: Oral   Oral  SpO2: 99% 99% 97%   Weight:      Height:        Physical Exam Vitals and nursing note reviewed.  Constitutional:      General: He is not in acute distress.    Appearance: He is well-developed and normal weight. He is not ill-appearing.  HENT:     Head: Normocephalic and atraumatic.     Mouth/Throat:     Mouth: Mucous membranes are moist.  Cardiovascular:     Rate and Rhythm: Normal rate and regular rhythm.     Heart sounds: Normal heart sounds. No murmur heard.    No gallop.  Pulmonary:     Effort: Pulmonary effort is normal.     Breath sounds: Normal breath sounds.  Abdominal:  General: Abdomen is flat. Bowel sounds are normal. There is no distension. There are no signs of injury.     Palpations: Abdomen is soft. There is no hepatomegaly or mass.     Tenderness: There is abdominal tenderness in the epigastric area. There is no guarding or rebound.     Comments: Mild tenderness to palpation upper abdomen - patient had just received dilaudid 1 mg.   Skin:    General: Skin is warm and dry.  Neurological:     General: No focal deficit present.     Mental Status: He is alert and oriented to person, place, and time.  Psychiatric:        Mood and Affect: Mood normal.        Behavior: Behavior normal.       Labs on Admission: I have personally reviewed following labs and imaging studies  CBC: Recent Labs  Lab 11/05/22 1239  WBC 14.6*  HGB 13.1  HCT 41.5  MCV 73.8*  PLT 388   Basic Metabolic Panel: Recent Labs  Lab 11/05/22 1239  NA 137  K 3.6  CL 108  CO2 21*  GLUCOSE 114*  BUN 10  CREATININE 1.07  CALCIUM 9.3   GFR: Estimated Creatinine Clearance: 108.3 mL/min (by C-G formula based on SCr of 1.07 mg/dL). Liver Function Tests: Recent Labs  Lab 11/05/22 1239  AST 29  ALT 34  ALKPHOS 64  BILITOT 0.9  PROT 8.1  ALBUMIN 4.6   Recent Labs  Lab 11/05/22 1239  LIPASE 369*   No results for input(s): "AMMONIA" in the last 168 hours. Coagulation Profile: No results for input(s): "INR", "PROTIME" in the last 168 hours. Cardiac Enzymes: No results for input(s): "CKTOTAL", "CKMB", "CKMBINDEX", "TROPONINI" in the last 168 hours. BNP (last 3 results) No results for input(s): "PROBNP" in the last 8760 hours. HbA1C: No results for input(s): "HGBA1C" in the last 72 hours. CBG: No results for input(s): "GLUCAP" in the last 168 hours. Lipid Profile: No results for input(s): "CHOL", "HDL", "LDLCALC", "TRIG", "CHOLHDL", "LDLDIRECT" in the last 72 hours. Thyroid Function Tests: No results for input(s): "TSH", "T4TOTAL", "FREET4", "T3FREE", "THYROIDAB" in the last 72 hours. Anemia Panel: No results for input(s): "VITAMINB12", "FOLATE", "FERRITIN", "TIBC", "IRON", "RETICCTPCT" in the last 72 hours. Urine analysis:    Component Value Date/Time   COLORURINE YELLOW 11/05/2022 1219   APPEARANCEUR CLEAR 11/05/2022 1219   LABSPEC 1.027 11/05/2022 1219   PHURINE 5.0 11/05/2022 1219   GLUCOSEU NEGATIVE 11/05/2022 1219   HGBUR SMALL (A) 11/05/2022 1219   BILIRUBINUR NEGATIVE 11/05/2022 1219   KETONESUR 80 (A) 11/05/2022 1219   PROTEINUR 100 (A) 11/05/2022 1219   NITRITE NEGATIVE 11/05/2022 1219   LEUKOCYTESUR NEGATIVE 11/05/2022 1219    Radiological Exams on Admission: I  have personally reviewed images CT ABDOMEN PELVIS W CONTRAST  Result Date: 11/05/2022 CLINICAL DATA:  Acute nonlocalized abdominal pain. Left lower abdominal pain for 3 days. Vomiting today. EXAM: CT ABDOMEN AND PELVIS WITH CONTRAST TECHNIQUE: Multidetector CT imaging of the abdomen and pelvis was performed using the standard protocol following bolus administration of intravenous contrast. RADIATION DOSE REDUCTION: This exam was performed according to the departmental dose-optimization program which includes automated exposure control, adjustment of the mA and/or kV according to patient size and/or use of iterative reconstruction technique. CONTRAST:  OMNIPAQUE IOHEXOL 300 MG/ML  SOLN COMPARISON:  04/28/2021 FINDINGS: Lower chest: Lung bases are clear. Hepatobiliary: No focal liver abnormality is seen. No gallstones, gallbladder wall thickening, or  biliary dilatation. Pancreas: Peripancreatic infiltration and edema with small collection inferior to the body of the pancreas measuring 1.5 cm diameter. Changes are consistent with acute pancreatitis. No ductal dilatation or pancreatic necrosis. Spleen: Normal in size without focal abnormality. Adrenals/Urinary Tract: Adrenal glands are unremarkable. Kidneys are normal, without renal calculi, focal lesion, or hydronephrosis. Bladder is unremarkable. Stomach/Bowel: Stomach is within normal limits. Appendix appears normal. No evidence of bowel wall thickening, distention, or inflammatory changes. Vascular/Lymphatic: No significant vascular findings are present. No enlarged abdominal or pelvic lymph nodes. Reproductive: Prostate is unremarkable. Other: No abdominal wall hernia or abnormality. No abdominopelvic ascites. Musculoskeletal: No acute or significant osseous findings. IMPRESSION: 1. Diffuse peripancreatic stranding and edema with small loculated collection consistent with acute pancreatitis. No pancreatic necrosis identified. 2. Otherwise, no acute changes  identified. Electronically Signed   By: Burman Nieves M.D.   On: 11/05/2022 15:34    EKG: I have personally reviewed EKG: no EKG  Assessment/Plan Active Problems:   Acute alcoholic pancreatitis   Alcohol dependence syndrome (HCC)    Assessment and Plan: Alcohol dependence syndrome (HCC) Patient is a regular drinker - approximately 1 pint of spirits a day. No history of withdrawal issues.   Plan Counseled patient of the need for abstinence  Strongly recommended AA  May discuss medical therapy with his primary care provider  Acute alcoholic pancreatitis Patient drinks 1 pint of spirits most days. He has had 3 prior bouts of pancreatitis. Last drink 72 hrs ago. Now with recurrent epigastric pain, N/V. Lipase elevated. CT reveals acute pancreastitis with peripancreatic stranding, no necrosis, no abscess. Patient received IV F bolus in ED  Plan Med-surg obs admit  Full liquid diet  Dilaudid 1 mg q4 prn pain  Lipase in AM       DVT prophylaxis: Lovenox Code Status: Full Code Family Communication: fiance present during interview  Disposition Plan: home 24-48 hrs  Consults called: none  Admission status: Observation, Med-Surg   Illene Regulus, MD [facility] 11/05/2022, 6:03 PM

## 2022-11-05 NOTE — Assessment & Plan Note (Signed)
Patient is a regular drinker - approximately 1 pint of spirits a day. No history of withdrawal issues.   Plan Counseled patient of the need for abstinence  Strongly recommended AA  May discuss medical therapy with his primary care provider

## 2022-11-05 NOTE — Subjective & Objective (Signed)
Angel Prince, a 37 y/o, drinks on a regular basis. He has had 3 prior bouts of pancreatitis. His last drink was 72 hours ago. He developed progressive upper abdominal pain along with N/V. These symptoms were familiar to him from previous episodes of pancreatitis. He presents to [facility] for evaluation.

## 2022-11-05 NOTE — ED Triage Notes (Signed)
Pt BIB [facility] for left lower abd pain for past 3 days.  Emesis this morning, denies any diarrhea. Pt admits to drinking ETOH 3 days ago.

## 2022-11-05 NOTE — Assessment & Plan Note (Signed)
Patient drinks 1 pint of spirits most days. He has had 3 prior bouts of pancreatitis. Last drink 72 hrs ago. Now with recurrent epigastric pain, N/V. Lipase elevated. CT reveals acute pancreastitis with peripancreatic stranding, no necrosis, no abscess. Patient received IV F bolus in ED  Plan Med-surg obs admit  Full liquid diet  Dilaudid 1 mg q4 prn pain  Lipase in AM

## 2022-11-06 DIAGNOSIS — F1020 Alcohol dependence, uncomplicated: Secondary | ICD-10-CM

## 2022-11-06 DIAGNOSIS — K852 Alcohol induced acute pancreatitis without necrosis or infection: Secondary | ICD-10-CM | POA: Diagnosis not present

## 2022-11-06 LAB — LIPASE, BLOOD: Lipase: 220 U/L — ABNORMAL HIGH (ref 11–51)

## 2022-11-06 MED ORDER — FOLIC ACID 1 MG PO TABS: 1.0000 mg | ORAL_TABLET | Freq: Every day | ORAL | 0 refills | Status: AC

## 2022-11-06 MED ORDER — OXYCODONE HCL 5 MG PO TABS: 5.0000 mg | ORAL_TABLET | Freq: Four times a day (QID) | ORAL | 0 refills | Status: AC | PRN

## 2022-11-06 MED ORDER — THIAMINE HCL 100 MG/ML IJ SOLN: 100.0000 mg | Freq: Every day | INTRAMUSCULAR | Status: DC

## 2022-11-06 MED ORDER — FOLIC ACID 1 MG PO TABS
1.0000 mg | ORAL_TABLET | Freq: Every day | ORAL | Status: DC
  Administered 2022-11-06: 1 mg via ORAL

## 2022-11-06 MED ORDER — LORAZEPAM 2 MG/ML IJ SOLN: 1.0000 mg | INTRAMUSCULAR | Status: DC | PRN

## 2022-11-06 MED ORDER — LORAZEPAM 1 MG PO TABS: 1.0000 mg | ORAL_TABLET | ORAL | Status: DC | PRN

## 2022-11-06 MED ORDER — LACTATED RINGERS IV SOLN: INTRAVENOUS | Status: DC

## 2022-11-06 MED ORDER — ADULT MULTIVITAMIN W/MINERALS CH: 1.0000 | ORAL_TABLET | Freq: Every day | ORAL | Status: AC

## 2022-11-06 MED ORDER — LORAZEPAM 1 MG PO TABS: 0.0000 mg | ORAL_TABLET | Freq: Four times a day (QID) | ORAL | Status: DC

## 2022-11-06 MED ORDER — VITAMIN B-1 100 MG PO TABS: 100.0000 mg | ORAL_TABLET | Freq: Every day | ORAL | 0 refills | Status: AC

## 2022-11-06 MED ORDER — LORAZEPAM 1 MG PO TABS: 0.0000 mg | ORAL_TABLET | Freq: Two times a day (BID) | ORAL | Status: DC

## 2022-11-06 MED ORDER — HYDROMORPHONE HCL 1 MG/ML IJ SOLN
0.5000 mg | INTRAMUSCULAR | Status: DC | PRN
  Administered 2022-11-06: 0.5 mg via INTRAVENOUS
  Filled 2022-11-06: qty 0.5

## 2022-11-06 MED ORDER — ADULT MULTIVITAMIN W/MINERALS CH
1.0000 | ORAL_TABLET | Freq: Every day | ORAL | Status: DC
  Administered 2022-11-06: 1 via ORAL

## 2022-11-06 MED ORDER — ONDANSETRON HCL 4 MG PO TABS: 4.0000 mg | ORAL_TABLET | Freq: Three times a day (TID) | ORAL | 0 refills | Status: AC | PRN

## 2022-11-06 MED ORDER — FOLIC ACID 1 MG PO TABS
ORAL_TABLET | ORAL | Status: AC
  Filled 2022-11-06: qty 1

## 2022-11-06 MED ORDER — ADULT MULTIVITAMIN W/MINERALS CH
ORAL_TABLET | ORAL | Status: AC
  Filled 2022-11-06: qty 1

## 2022-11-06 MED ORDER — THIAMINE MONONITRATE 100 MG PO TABS
ORAL_TABLET | ORAL | Status: AC
  Filled 2022-11-06: qty 1

## 2022-11-06 MED ORDER — THIAMINE MONONITRATE 100 MG PO TABS
100.0000 mg | ORAL_TABLET | Freq: Every day | ORAL | Status: DC
  Administered 2022-11-06: 100 mg via ORAL

## 2022-11-06 NOTE — Hospital Course (Signed)
37 y/o, drinks on a regular basis. He has had 3 prior bouts of pancreatitis. His last drink was 72 hours ago. He developed progressive upper abdominal pain along with N/V. These symptoms were familiar to him from previous episodes of pancreatitis. He presents to [facility] for evaluation.     ED Course: T 98.5  141/85  HR 59  RR 16. Patient at time of admission was in no distress. Lab: glucose 114, lipase 369, WBC 14.6. CT reveals peripancreatic strandings c/w acute pancreatitis. In ED patient received IV fluids and Dilaudid for pain control. [facility] called to admit for continued treatment of acute pancreatitis

## 2022-11-06 NOTE — Discharge Summary (Signed)
Physician Discharge Summary  Angel Prince VHQ:[id number] DOB: 12-22-1985 DOA: 11/05/2022  PCP: Angel Chimera, MD  Admit date: 11/05/2022 Discharge date: 11/06/2022  Admitted From:  Home  Disposition: Home   Recommendations for Outpatient Follow-up:  Follow up with PCP in 1 weeks Please abstain from all alcohol consumption  Discharge Condition: STABLE   CODE STATUS: FULL DIET: soft foods recommended    Brief Hospitalization Summary: Please see all hospital notes, images, labs for full details of the hospitalization. Admission provider HPI:  37 y/o, drinks on a regular basis. He has had 3 prior bouts of pancreatitis. His last drink was 72 hours ago. He developed progressive upper abdominal pain along with N/V. These symptoms were familiar to him from previous episodes of pancreatitis. He presents to [facility] for evaluation.     ED Course: T 98.5  141/85  HR 59  RR 16. Patient at time of admission was in no distress. Lab: glucose 114, lipase 369, WBC 14.6. CT reveals peripancreatic strandings c/w acute pancreatitis. In ED patient received IV fluids and Dilaudid for pain control. TRH called to admit for continued treatment of acute pancreatitis  Hospital Course Pt was admitted for observation for a mild case of pancreatitis treated supportively and then advanced his diet which he has tolerated well and wanting to go home. He feels that his pain is controlled and he is tolerating diet and he is discharged home with instructions to avoid all alcohol consumption and advised to return if he develops uncontrolled pain, nausea or vomiting.  He can return to work in 2 days.    Discharge Diagnoses:  Active Problems:   Acute alcoholic pancreatitis   Alcohol dependence syndrome [facility])   Discharge Instructions:  Allergies as of 11/06/2022   No Known Allergies      Medication List     TAKE these medications    folic acid 1 MG tablet Commonly known as: FOLVITE Take 1 tablet (1 mg total) by  mouth daily. Start taking on: November 07, 2022   ibuprofen 200 MG tablet Commonly known as: ADVIL Take 400 mg by mouth daily as needed for headache or moderate pain.   multivitamin with minerals Tabs tablet Take 1 tablet by mouth daily. Start taking on: November 07, 2022   ondansetron 4 MG tablet Commonly known as: Zofran Take 1 tablet (4 mg total) by mouth every 8 (eight) hours as needed for nausea or vomiting.   oxyCODONE 5 MG immediate release tablet Commonly known as: Roxicodone Take 1 tablet (5 mg total) by mouth every 6 (six) hours as needed for up to 5 days for severe pain or breakthrough pain.   thiamine 100 MG tablet Commonly known as: Vitamin B-1 Take 1 tablet (100 mg total) by mouth daily. Start taking on: November 07, 2022        Follow-up Information     Angel Chimera, MD Follow up in 1 week(s).   Specialty: Family Medicine Why: Hospital Follow Up Contact information: [address] [place] [address] [phone]                No Known Allergies Allergies as of 11/06/2022   No Known Allergies      Medication List     TAKE these medications    folic acid 1 MG tablet Commonly known as: FOLVITE Take 1 tablet (1 mg total) by mouth daily. Start taking on: November 07, 2022   ibuprofen 200 MG tablet Commonly known as: ADVIL Take  400 mg by mouth daily as needed for headache or moderate pain.   multivitamin with minerals Tabs tablet Take 1 tablet by mouth daily. Start taking on: November 07, 2022   ondansetron 4 MG tablet Commonly known as: Zofran Take 1 tablet (4 mg total) by mouth every 8 (eight) hours as needed for nausea or vomiting.   oxyCODONE 5 MG immediate release tablet Commonly known as: Roxicodone Take 1 tablet (5 mg total) by mouth every 6 (six) hours as needed for up to 5 days for severe pain or breakthrough pain.   thiamine 100 MG tablet Commonly known as: Vitamin B-1 Take 1 tablet (100 mg total) by mouth daily. Start taking on: November 07, 2022        Procedures/Studies: CT ABDOMEN PELVIS W CONTRAST  Result Date: 11/05/2022 CLINICAL DATA:  Acute nonlocalized abdominal pain. Left lower abdominal pain for 3 days. Vomiting today. EXAM: CT ABDOMEN AND PELVIS WITH CONTRAST TECHNIQUE: Multidetector CT imaging of the abdomen and pelvis was performed using the standard protocol following bolus administration of intravenous contrast. RADIATION DOSE REDUCTION: This exam was performed according to the departmental dose-optimization program which includes automated exposure control, adjustment of the mA and/or kV according to patient size and/or use of iterative reconstruction technique. CONTRAST:  OMNIPAQUE IOHEXOL 300 MG/ML  SOLN COMPARISON:  04/28/2021 FINDINGS: Lower chest: Lung bases are clear. Hepatobiliary: No focal liver abnormality is seen. No gallstones, gallbladder wall thickening, or biliary dilatation. Pancreas: Peripancreatic infiltration and edema with small collection inferior to the body of the pancreas measuring 1.5 cm diameter. Changes are consistent with acute pancreatitis. No ductal dilatation or pancreatic necrosis. Spleen: Normal in size without focal abnormality. Adrenals/Urinary Tract: Adrenal glands are unremarkable. Kidneys are normal, without renal calculi, focal lesion, or hydronephrosis. Bladder is unremarkable. Stomach/Bowel: Stomach is within normal limits. Appendix appears normal. No evidence of bowel wall thickening, distention, or inflammatory changes. Vascular/Lymphatic: No significant vascular findings are present. No enlarged abdominal or pelvic lymph nodes. Reproductive: Prostate is unremarkable. Other: No abdominal wall hernia or abnormality. No abdominopelvic ascites. Musculoskeletal: No acute or significant osseous findings. IMPRESSION: 1. Diffuse peripancreatic stranding and edema with small loculated collection consistent with acute pancreatitis. No pancreatic necrosis identified. 2. Otherwise, no acute  changes identified. Electronically Signed   By: Burman Nieves M.D.   On: 11/05/2022 15:34     Subjective: Pt has tolerated breakfast and lunch with no problems and pain is much better controlled and he wants to go home today after I asked him on 2 separate occasions.   Discharge Exam: Vitals:   11/05/22 1838 11/05/22 2150  BP: 121/74 139/75  Pulse: (!) 55 (!) 59  Resp: 20 18  Temp: 97.9 F (36.6 C) 97.7 F (36.5 C)  SpO2: 100% 98%   Vitals:   11/05/22 1500 11/05/22 1610 11/05/22 1838 11/05/22 2150  BP: (!) 141/85  121/74 139/75  Pulse: (!) 59  (!) 55 (!) 59  Resp: 16  20 18   Temp:  98.5 F (36.9 C) 97.9 F (36.6 C) 97.7 F (36.5 C)  TempSrc:  Oral Oral Oral  SpO2: 97%  100% 98%  Weight:   91.6 kg   Height:   5\' 9"  (1.753 m)     General: Pt is alert, awake, not in acute distress Cardiovascular: RRR, S1/S2 +, no rubs, no gallops Respiratory: CTA bilaterally, no wheezing, no rhonchi Abdominal: Soft, NT, ND, bowel sounds + Extremities: no edema, no cyanosis   The results of significant  diagnostics from this hospitalization (including imaging, microbiology, ancillary and laboratory) are listed below for reference.     Microbiology: No results found for this or any previous visit (from the past 240 hour(s)).   Labs: BNP (last 3 results) No results for input(s): "BNP" in the last 8760 hours. Basic Metabolic Panel: Recent Labs  Lab 11/05/22 1239  NA 137  K 3.6  CL 108  CO2 21*  GLUCOSE 114*  BUN 10  CREATININE 1.07  CALCIUM 9.3   Liver Function Tests: Recent Labs  Lab 11/05/22 1239  AST 29  ALT 34  ALKPHOS 64  BILITOT 0.9  PROT 8.1  ALBUMIN 4.6   Recent Labs  Lab 11/05/22 1239 11/06/22 0408  LIPASE 369* 220*   No results for input(s): "AMMONIA" in the last 168 hours. CBC: Recent Labs  Lab 11/05/22 1239  WBC 14.6*  HGB 13.1  HCT 41.5  MCV 73.8*  PLT 388   Cardiac Enzymes: No results for input(s): "CKTOTAL", "CKMB", "CKMBINDEX",  "TROPONINI" in the last 168 hours. BNP: Invalid input(s): "POCBNP" CBG: No results for input(s): "GLUCAP" in the last 168 hours. D-Dimer No results for input(s): "DDIMER" in the last 72 hours. Hgb A1c No results for input(s): "HGBA1C" in the last 72 hours. Lipid Profile No results for input(s): "CHOL", "HDL", "LDLCALC", "TRIG", "CHOLHDL", "LDLDIRECT" in the last 72 hours. Thyroid function studies No results for input(s): "TSH", "T4TOTAL", "T3FREE", "THYROIDAB" in the last 72 hours.  Invalid input(s): "FREET3" Anemia work up No results for input(s): "VITAMINB12", "FOLATE", "FERRITIN", "TIBC", "IRON", "RETICCTPCT" in the last 72 hours. Urinalysis    Component Value Date/Time   COLORURINE YELLOW 11/05/2022 1219   APPEARANCEUR CLEAR 11/05/2022 1219   LABSPEC 1.027 11/05/2022 1219   PHURINE 5.0 11/05/2022 1219   GLUCOSEU NEGATIVE 11/05/2022 1219   HGBUR SMALL (A) 11/05/2022 1219   BILIRUBINUR NEGATIVE 11/05/2022 1219   KETONESUR 80 (A) 11/05/2022 1219   PROTEINUR 100 (A) 11/05/2022 1219   NITRITE NEGATIVE 11/05/2022 1219   LEUKOCYTESUR NEGATIVE 11/05/2022 1219   Sepsis Labs Recent Labs  Lab 11/05/22 1239  WBC 14.6*   Microbiology No results found for this or any previous visit (from the past 240 hour(s)).  Time coordinating discharge:   SIGNED:  Standley Dakins, MD  [facility] 11/06/2022, 12:25 PM How to contact the [facility] Attending or Consulting provider 7A - 7P or covering provider during after hours 7P -7A, for this patient?  Check the care team in [facility] and look for a) attending/consulting [facility] provider listed and b) the [facility] team listed Log into [email or web address] and use [place]'s universal password to access. If you do not have the password, please contact the hospital operator. Locate the [facility] provider you are looking for under [facility] and page to a number that you can be directly reached. If you still have difficulty reaching the provider, please page  the [facility] (Director on Call) for the Hospitalists listed on amion for assistance.

## 2022-11-06 NOTE — Progress Notes (Signed)
Patient discharged home today, transported home by family. Discharge summary went over with patient, patient verbalized understanding. [employer] and work note sent with patient. Belongings sent home with patient.

## 2022-11-06 NOTE — TOC Initial Note (Signed)
Transition of Care [facility]) - Initial/Assessment Note    Patient Details  Name: Angel Prince MRN: [id number] Date of Birth: [birth year]  Transition [facility]) CM/SW Contact:    Annice Needy, LCSW Phone Number: 11/06/2022, 10:56 AM  Clinical Narrative:                 Patient admitted with Acute alcoholic pancreatitis,Alcohol dependence syndrome. TOC consulted for SA resources/education. TOC offered education/resources to patient. Patient declined indicating "I'm good." TOC signing off.   Expected Discharge Plan: Home/Self Care Barriers to Discharge: No Barriers Identified   Patient Goals and CMS Choice Patient states their goals for this hospitalization and ongoing recovery are:: return home          Expected Discharge Plan and Services                                              Prior Living Arrangements/Services                       Activities of Daily Living      Permission Sought/Granted                  Emotional Assessment     Affect (typically observed): Appropriate Orientation: : Oriented to Self, Oriented to Place, Oriented to  Time, Oriented to Situation Alcohol / Substance Use: Not Applicable Psych Involvement: No (comment)  Admission diagnosis:  Acute pancreatitis [K85.90] Acute pancreatitis without infection or necrosis, unspecified pancreatitis type [K85.90] Patient Active Problem List   Diagnosis Date Noted   Acute pancreatitis 11/05/2022   Alcohol dependence syndrome (HCC) 04/28/2021   Acute alcoholic pancreatitis 12/22/2020   Pancreatitis 12/21/2020   Seasonal and perennial allergic rhinitis 03/26/2018   Rhinitis medicamentosa 03/26/2018   PCP:  [name], MD Pharmacy:   [phone] - [place], [place] - [address] AT [facility] [address] [place] [address] Phone: [phone] Fax: [phone]     Social Determinants of Health (SDOH) Social History: SDOH  Screenings   Tobacco Use: Low Risk  (11/05/2022)   SDOH Interventions:     Readmission Risk Interventions     No data to display

## 2022-11-06 NOTE — Discharge Instructions (Signed)
IMPORTANT INFORMATION: PAY CLOSE ATTENTION   PHYSICIAN DISCHARGE INSTRUCTIONS  Follow with Primary care provider  Daniel, Terry G, MD  and other consultants as instructed by your Hospitalist Physician  SEEK MEDICAL CARE OR RETURN TO EMERGENCY ROOM IF SYMPTOMS COME BACK, WORSEN OR NEW PROBLEM DEVELOPS   Please note: You were cared for by a hospitalist during your hospital stay. Every effort will be made to forward records to your primary care provider.  You can request that your primary care provider send for your hospital records if they have not received them.  Once you are discharged, your primary care physician will handle any further medical issues. Please note that NO REFILLS for any discharge medications will be authorized once you are discharged, as it is imperative that you return to your primary care physician (or establish a relationship with a primary care physician if you do not have one) for your post hospital discharge needs so that they can reassess your need for medications and monitor your lab values.  Please get a complete blood count and chemistry panel checked by your Primary MD at your next visit, and again as instructed by your Primary MD.  Get Medicines reviewed and adjusted: Please take all your medications with you for your next visit with your Primary MD  Laboratory/radiological data: Please request your Primary MD to go over all hospital tests and procedure/radiological results at the follow up, please ask your primary care provider to get all Hospital records sent to his/her office.  In some cases, they will be blood work, cultures and biopsy results pending at the time of your discharge. Please request that your primary care provider follow up on these results.  If you are diabetic, please bring your blood sugar readings with you to your follow up appointment with primary care.    Please call and make your follow up appointments as soon as possible.    Also Note  the following: If you experience worsening of your admission symptoms, develop shortness of breath, life threatening emergency, suicidal or homicidal thoughts you must seek medical attention immediately by calling 911 or calling your MD immediately  if symptoms less severe.  You must read complete instructions/literature along with all the possible adverse reactions/side effects for all the Medicines you take and that have been prescribed to you. Take any new Medicines after you have completely understood and accpet all the possible adverse reactions/side effects.   Do not drive when taking Pain medications or sleeping medications (Benzodiazepines)  Do not take more than prescribed Pain, Sleep and Anxiety Medications. It is not advisable to combine anxiety,sleep and pain medications without talking with your primary care practitioner  Special Instructions: If you have smoked or chewed Tobacco  in the last 2 yrs please stop smoking, stop any regular Alcohol  and or any Recreational drug use.  Wear Seat belts while driving.  Do not drive if taking any narcotic, mind altering or controlled substances or recreational drugs or alcohol.       

## 2023-09-03 IMAGING — CT CT ABD-PELV W/ CM
2 of 4 series · 16 of 46 positions shown, 18 images · IV contrast (omnipaque)
Comparison: CT abdomen pelvis dated 12/21/2020.

CLINICAL DATA: Nausea and vomiting.

EXAM:
CT ABDOMEN AND PELVIS WITH CONTRAST
TECHNIQUE: Multidetector CT imaging of the abdomen and pelvis was performed
using the standard protocol following bolus administration of
intravenous contrast.
CONTRAST:  80mL OMNIPAQUE IOHEXOL 350 MG/ML SOLN

[Series 2: axial st · axial · 0.91mm/px · z∈[-480,-60]mm · 13 of 96 slices shown, 15 images]
[im 6/96  soft-tissue]
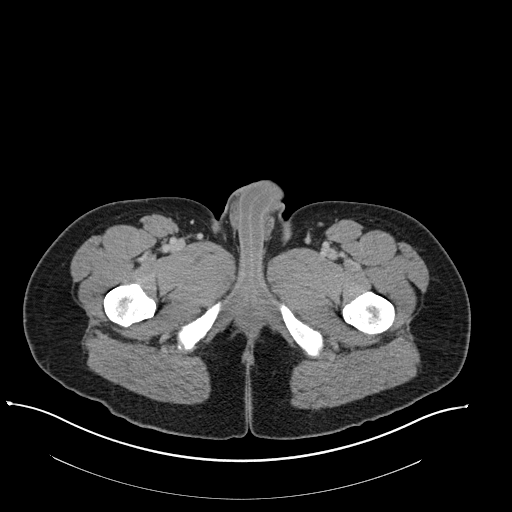
[im 6/96  bone]
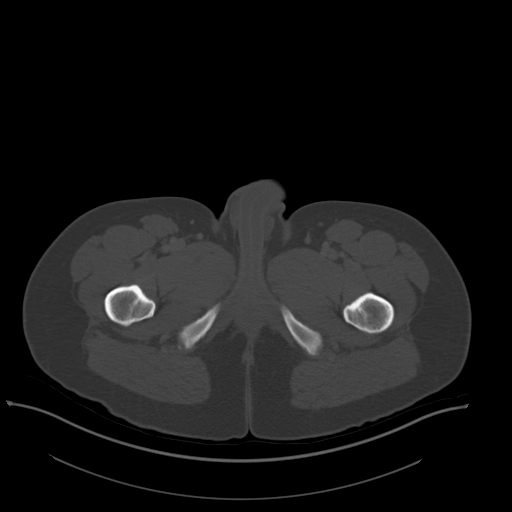
[im 11/96  soft-tissue]
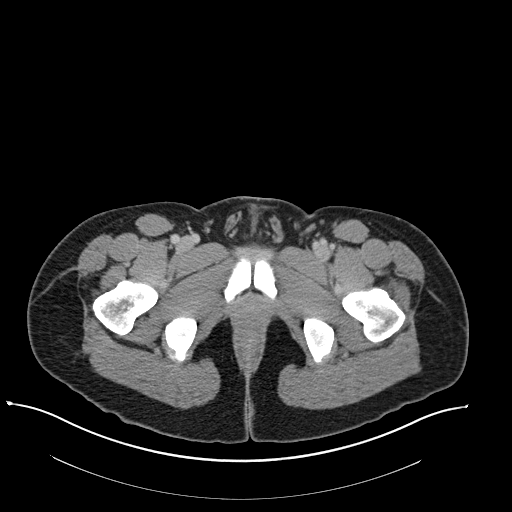
[im 22/96  soft-tissue]
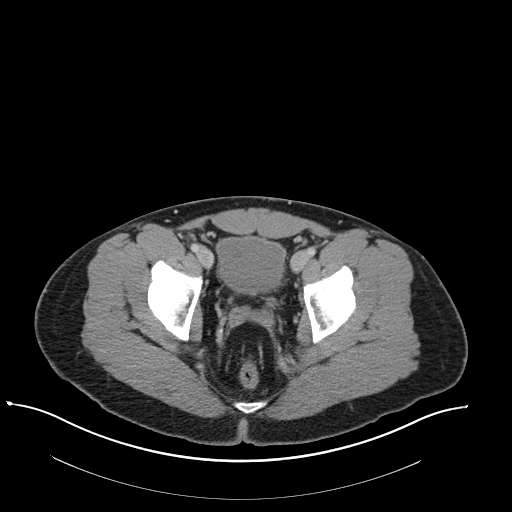
[im 27/96  soft-tissue]
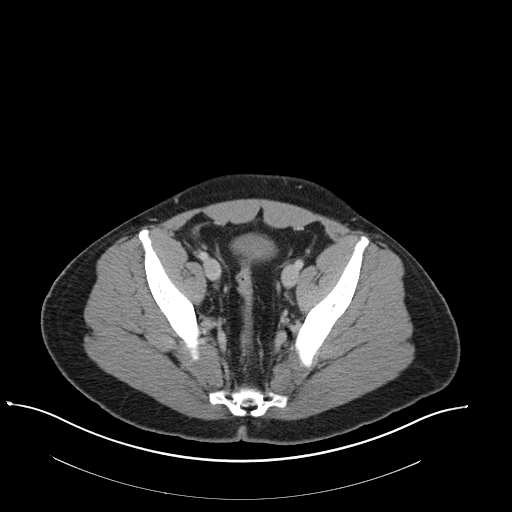
[im 32/96  soft-tissue]
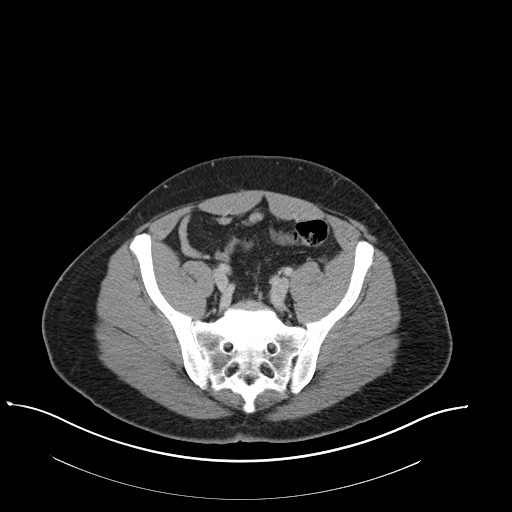
[im 43/96  soft-tissue]
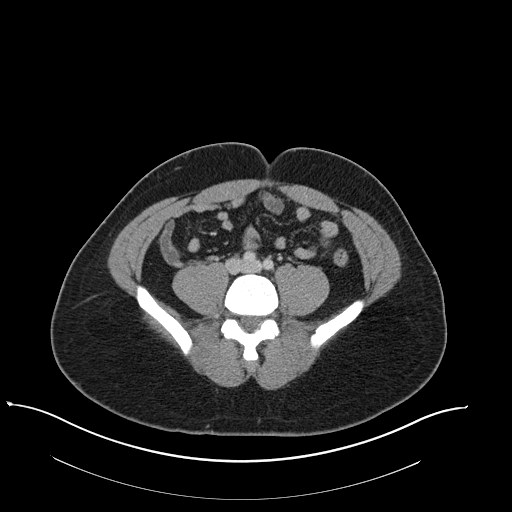
[im 48/96  soft-tissue]
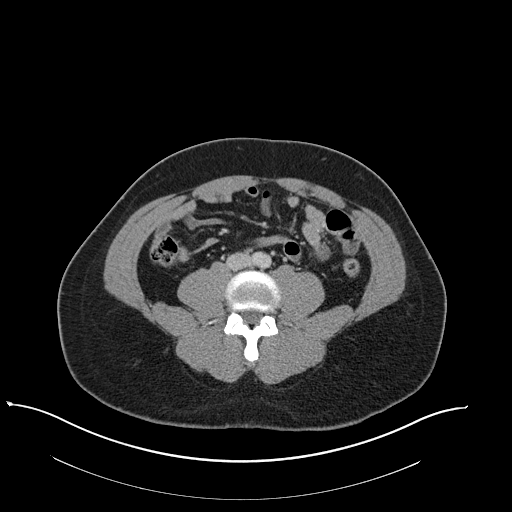
[im 53/96  soft-tissue]
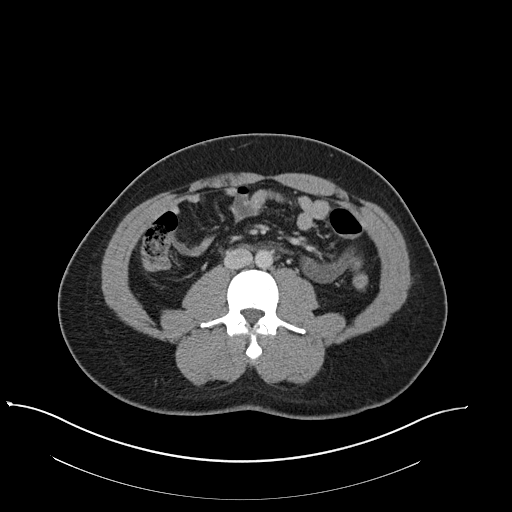
[im 64/96  soft-tissue]
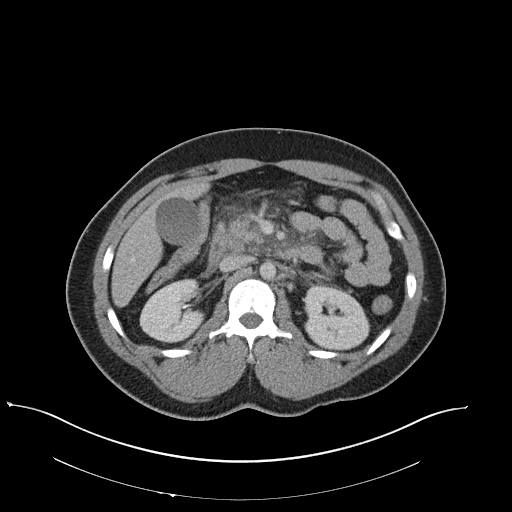
[im 64/96  bone]
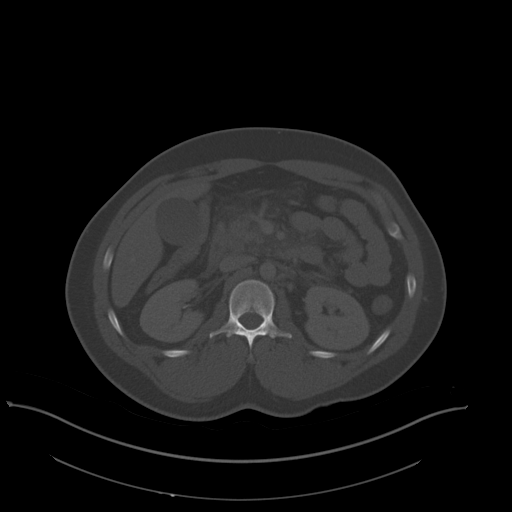
[im 69/96  soft-tissue]
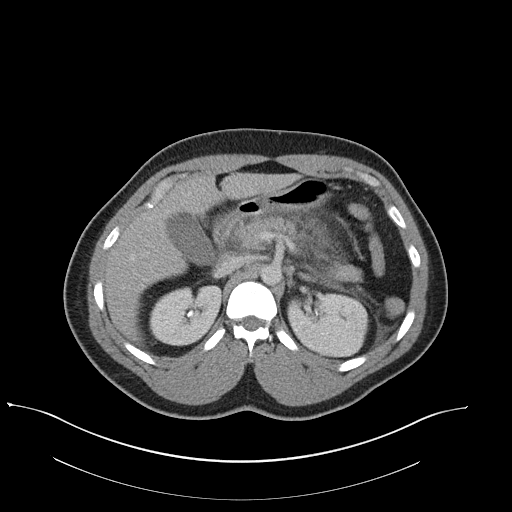
[im 74/96  soft-tissue]
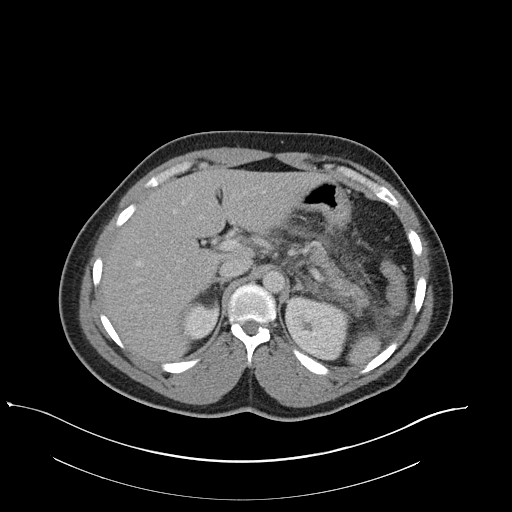
[im 85/96  soft-tissue]
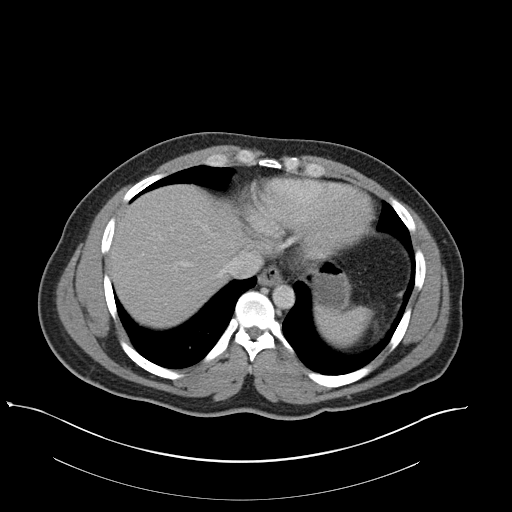
[im 90/96  soft-tissue]
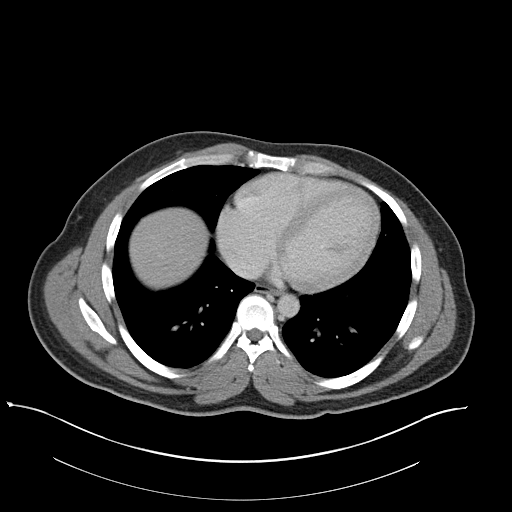

[Series 5: coronal st · coronal · 0.78mm/px · 3 of 153 slices shown]
[im 51/153  soft-tissue]
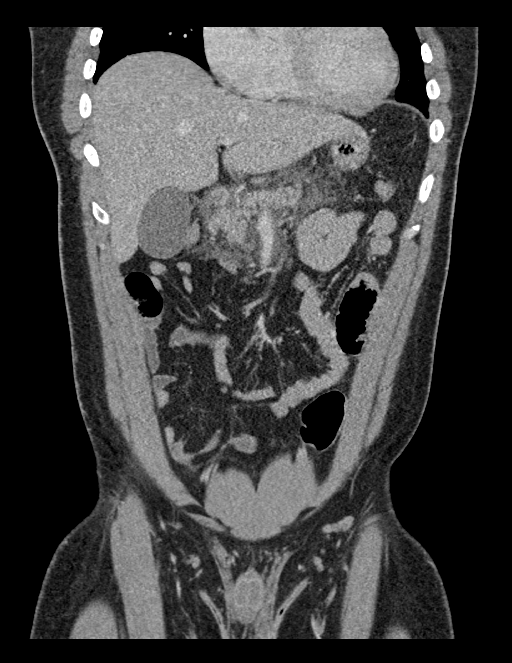
[im 68/153  soft-tissue]
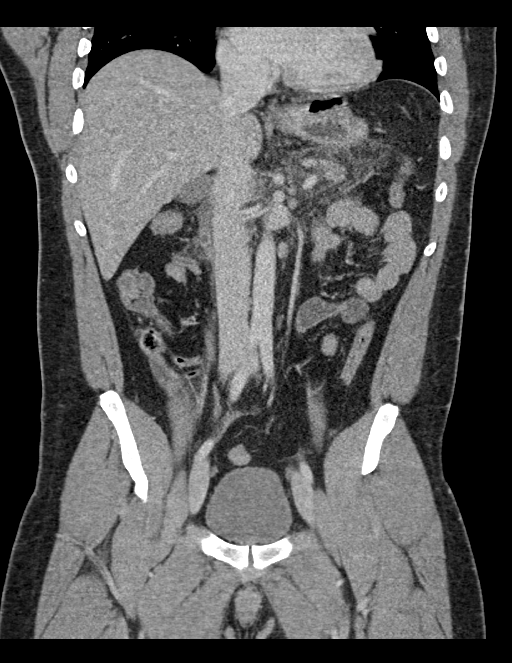
[im 85/153  soft-tissue]
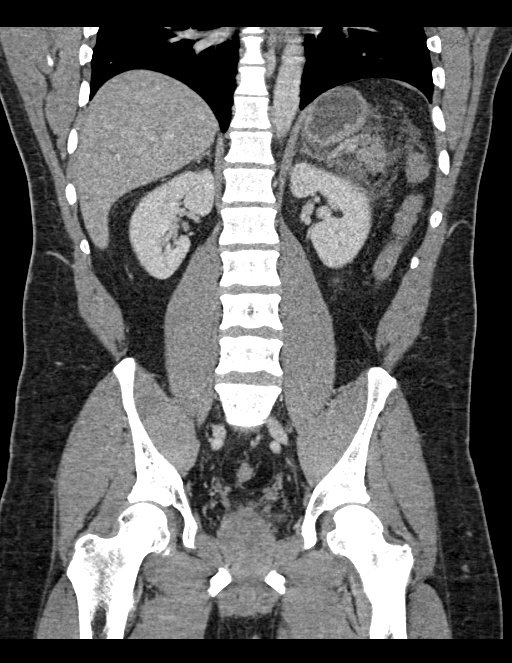

[16 of 46 positions shown; findings below may reference images not displayed]

FINDINGS: Lower chest: The visualized lung bases are clear.

No intra-abdominal free air or free fluid.

Hepatobiliary: No focal liver abnormality is seen. No gallstones,
gallbladder wall thickening, or biliary dilatation.

Pancreas: There is diffuse inflammatory changes of the pancreas
consistent with acute pancreatitis. No drainable fluid
collection/abscess or pseudocyst.

Spleen: Normal in size without focal abnormality.

Adrenals/Urinary Tract: The adrenal glands are unremarkable. Small
focal hypodensity in the upper pole of the right kidney may
represent an area of parenchymal scarring or infarct versus a small
cyst. There is no hydronephrosis on either side. The the visualized
ureters and urinary bladder appear unremarkable.

Stomach/Bowel: No bowel obstruction or active inflammation. The
appendix is normal.

Vascular/Lymphatic: The abdominal aorta and IVC are unremarkable. No
portal venous gas. There is no adenopathy.

Reproductive: The prostate and seminal vesicles are grossly
unremarkable.

Other: None

Musculoskeletal: No acute or significant osseous findings.
IMPRESSION: 1. Acute pancreatitis. No abscess or pseudocyst.
2. No bowel obstruction. Normal appendix.

## 2023-09-15 ENCOUNTER — Ambulatory Visit: Admitting: Surgical

## 2023-09-15 DIAGNOSIS — M25862 Other specified joint disorders, left knee: Secondary | ICD-10-CM

## 2023-09-16 ENCOUNTER — Encounter: Payer: Self-pay | Admitting: Surgical

## 2023-09-16 NOTE — Progress Notes (Signed)
 Office Visit Note   Patient: Angel Prince           Date of Birth: [birth year]           MRN: [id number] Visit Date: 09/15/2023 Requested by: Leesa Pulling, MD [address] [place],  [place] [address] PCP: Leesa Pulling, MD  Subjective: Chief Complaint  Patient presents with   Left Knee - Pain    HPI: Angel Prince is a 38 y.o. male who presents to the office reporting left knee pain.  Patient has had ongoing left knee pain localizing to the anterior medial aspect of the knee.  He has on and off swelling based on his activity level.  He works as a Pensions consultant at [employer] which involves a lot of walking and being on his feet.  He states that he has increased pain in the anterior aspect of the knee primarily with squatting or flexing the knee as well as with kneeling.  Really not able to kneel on his knee.  He denies any locking or mechanical symptoms such as clicking or popping.  Knee does not give out on him.  No history of injury that he can recall.  He enjoys occasionally playing basketball.  Does not do any weight lifting.  Enjoys raising his chickens.  Has not had any injection or surgery to the knee.  Currently out of work for couple days due to his stepdad passing away.  Going back to work on Thursday/Friday.  Takes occasional Tylenol  with some relief.  Has had prior MRI scan and evaluation with Dr. Agatha Horsfall..                ROS: All systems reviewed are negative as they relate to the chief complaint within the history of present illness.  Patient denies fevers or chills.  Assessment & Plan: Visit Diagnoses:  1. Cyst of left knee joint     Plan: Patient is a 38 year old male who presents for evaluation of left knee pain.  He has activity based knee pain with worsening symptoms over the last few months.  He has MRI that was ordered by Dr. Agatha Horsfall that was reviewed today after the scan was done back in December.  MRI demonstrates what appears to be parameniscal cyst in Hoffa's fat pad  stemming from some irregularity of the anterior horn of the medial meniscus.  There is no large meniscal tear noted on review.  Discussed patient with Dr. Rozelle Corning and recommended aspiration of the cyst with injection into the cyst.  We will have this done in [place] so that we can do this under ultrasound guidance.  Follow-up on Monday and if this fails to provide any lasting relief, next step would be surgical excision through arthroscopy.  Follow-Up Instructions: No follow-ups on file.   Orders:  No orders of the defined types were placed in this encounter.  No orders of the defined types were placed in this encounter.     Procedures: No procedures performed   Clinical Data: No additional findings.  Objective: Vital Signs: There were no vitals taken for this visit.  Physical Exam:  Constitutional: Patient appears well-developed HEENT:  Head: Normocephalic Eyes:EOM are normal Neck: Normal range of motion Cardiovascular: Normal rate Pulmonary/chest: Effort normal Neurologic: Patient is alert Skin: Skin is warm Psychiatric: Patient has normal mood and affect  Ortho Exam: Ortho exam demonstrates left knee with trace effusion.  Mild tenderness over the medial joint line anteriorly.  No tenderness over  the lateral joint line.  There is no crepitus noted with passive motion of the knee.  Excellent quad strength rated 5/5.  He has excellent stability to anterior and posterior drawer test with no significant laxity compared with the contralateral side.  Stable to varus and valgus stress at 0 and 30 degrees.  He does have some mild swelling noted on inspection around the anterior medial aspect of the proximal tibia.  Over this region it is tender and there is a palpable cyst that is somewhat mobile around the region of Hoffa's fat pad.  No pain with hip range of motion.  Able to perform straight leg raise.  Palpable DP pulse with intact ankle dorsiflexion plantarflexion.  No calf tenderness.   There is cellulitis or skin changes noted.  Specialty Comments:  No specialty comments available.  Imaging: No results found.   PMFS History: Patient Active Problem List   Diagnosis Date Noted   Acute pancreatitis 11/05/2022   Alcohol dependence syndrome (HCC) 04/28/2021   Acute alcoholic pancreatitis 12/22/2020   Pancreatitis 12/21/2020   Seasonal and perennial allergic rhinitis 03/26/2018   Rhinitis medicamentosa 03/26/2018   Past Medical History:  Diagnosis Date   Alcohol dependence (HCC)    Alcoholic pancreatitis     No family history on file.  No past surgical history on file. Social History   Occupational History   Occupation: Chartered certified accountant  Tobacco Use   Smoking status: Never   Smokeless tobacco: Never  Vaping Use   Vaping status: Every Day  Substance and Sexual Activity   Alcohol use: Yes    Comment: last drank 3 days ago   Drug use: Not Currently   Sexual activity: Not on file

## 2023-09-20 ENCOUNTER — Other Ambulatory Visit: Payer: Self-pay

## 2023-09-20 ENCOUNTER — Ambulatory Visit: Admitting: Surgical

## 2023-09-20 ENCOUNTER — Encounter: Payer: Self-pay | Admitting: Surgical

## 2023-09-20 DIAGNOSIS — M25862 Other specified joint disorders, left knee: Secondary | ICD-10-CM

## 2023-09-20 NOTE — Progress Notes (Signed)
   Procedure Note  Patient: Angel Prince             Date of Birth: 30-Dec-1985           MRN: [id number]             Visit Date: 09/20/2023  Procedures: Visit Diagnoses:  1. Cyst of left knee joint     Left knee cyst aspiration/injection on 09/20/2023 1:22 PM Indications: diagnostic evaluation, joint swelling and pain Details: 18 G 1.5 in needle, ultrasound-guided superolateral approach  Arthrogram: No  Medications: 5 mL lidocaine 1 %; 0.5 mL bupivacaine 0.25 %; 20 mg triamcinolone acetonide 40 MG/ML Aspirate: 2 mL Outcome: tolerated well, no immediate complications  Left knee cyst identified with ultrasound probe.  Needle placement into the cyst with successful aspiration of about 2 cc of gelatinous fluid.  Small injection administered and patient tolerated procedure well.  He will reach out to the office in about 1 week via MyChart to let us  know how he is doing.  He is welcome to reach out the meantime if he has increased pain or any concerns. Procedure, treatment alternatives, risks and benefits explained, specific risks discussed. Consent was given by the patient. Immediately prior to procedure a time out was called to verify the correct patient, procedure, equipment, support staff and site/side marked as required. Patient was prepped and draped in the usual sterile fashion.

## 2023-09-21 MED ORDER — TRIAMCINOLONE ACETONIDE 40 MG/ML IJ SUSP
20.0000 mg | INTRAMUSCULAR | Status: AC | PRN
  Administered 2023-09-20: 20 mg via INTRA_ARTICULAR

## 2023-09-21 MED ORDER — LIDOCAINE HCL 1 % IJ SOLN
5.0000 mL | INTRAMUSCULAR | Status: AC | PRN
Start: 2023-09-20 — End: 2023-09-20
  Administered 2023-09-20: 5 mL

## 2023-09-21 MED ORDER — BUPIVACAINE HCL 0.25 % IJ SOLN
0.5000 mL | INTRAMUSCULAR | Status: AC | PRN
Start: 2023-09-20 — End: 2023-09-20
  Administered 2023-09-20: .5 mL via INTRA_ARTICULAR

## 2023-11-10 ENCOUNTER — Encounter: Payer: Self-pay | Admitting: Orthopedic Surgery

## 2023-11-10 ENCOUNTER — Ambulatory Visit: Admitting: Orthopedic Surgery

## 2023-11-10 ENCOUNTER — Other Ambulatory Visit: Payer: Self-pay

## 2023-11-10 DIAGNOSIS — M25862 Other specified joint disorders, left knee: Secondary | ICD-10-CM | POA: Diagnosis not present

## 2023-11-10 MED ORDER — BUPIVACAINE HCL 0.25 % IJ SOLN
4.0000 mL | INTRAMUSCULAR | Status: AC | PRN
Start: 2023-11-10 — End: 2023-11-10
  Administered 2023-11-10: 4 mL via INTRA_ARTICULAR

## 2023-11-10 MED ORDER — TRIAMCINOLONE ACETONIDE 40 MG/ML IJ SUSP
20.0000 mg | INTRAMUSCULAR | Status: AC | PRN
  Administered 2023-11-10: 20 mg via INTRA_ARTICULAR

## 2023-11-10 MED ORDER — LIDOCAINE HCL 1 % IJ SOLN
5.0000 mL | INTRAMUSCULAR | Status: AC | PRN
  Administered 2023-11-10: 5 mL

## 2023-11-10 NOTE — Progress Notes (Signed)
 Office Visit Note   Patient: Angel Prince           Date of Birth: [birth year]           MRN: [id number] Visit Date: 11/10/2023 Requested by: Toribio Jerel MATSU, MD [address] [place],  [place] [address] PCP: Toribio Jerel MATSU, MD  Subjective: Chief Complaint  Patient presents with   Left Knee - Follow-up    Patient reports recurrent pain and swelling in left knee    HPI: Angel Prince is a 38 y.o. male who presents to the office reporting left knee pain.  About 5 weeks ago he had aspiration and injection of a cyst adjacent to the medial aspect of the patellar tendon.  This is visible on MRI scan.  Possible lateral meniscal pathology also present on the scan.  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  Does not really work on his knees.  Symptoms for a year.  Uses topical which does not help much.  Did have very good relief from the aspiration and injection until about 3 to 4 days ago.  Noticed increased pain at that time..                ROS: All systems reviewed are negative as they relate to the chief complaint within the history of present illness.  Patient denies fevers or chills.  Assessment & Plan: Visit Diagnoses:  1. Cyst of left knee joint     Plan: Impression is recurrent left knee cystic structure adjacent to the mid to distal aspect of the patellar tendon on the medial side.  Been present for a year.  Last aspiration and injection gave him a month of relief.  Thus repeated today.  Arthroscopic evaluation and debridement could be performed.  We would want to decompress that cyst fully as well as cauterize all the tissue leading from the medial meniscus to this region.  The risk and benefits of the procedure discussed with the patient including not limited to infection nerve and vessel damage cyst recurrence as well as incomplete pain relief and incomplete restoration of function.  As a Pensions consultant he may need to be out of work for 2 to 3 weeks.  No personal or  family history of DVT or pulmonary embolism.  Follow-Up Instructions: No follow-ups on file.   Orders:  Orders Placed This Encounter  Procedures   US  Guided Needle Placement - No Linked Charges   No orders of the defined types were placed in this encounter.     Procedures: Large Joint Inj: L knee on 11/10/2023 9:19 PM Indications: diagnostic evaluation, joint swelling and pain Details: 22 G 1.5 in needle, ultrasound-guided superolateral approach  Arthrogram: No  Medications: 5 mL lidocaine  1 %; 4 mL bupivacaine  0.25 %; 20 mg triamcinolone  acetonide 40 MG/ML Outcome: tolerated well, no immediate complications Procedure, treatment alternatives, risks and benefits explained, specific risks discussed. Consent was given by the patient. Immediately prior to procedure a time out was called to verify the correct patient, procedure, equipment, support staff and site/side marked as required. Patient was prepped and draped in the usual sterile fashion.     Cyst adjacent to patellar tendon on the medial side is aspirated and injected today.  Clinical Data: No additional findings.  Objective: Vital Signs: There were no vitals taken for this visit.  Physical Exam:  Constitutional: Patient appears well-developed HEENT:  Head: Normocephalic Eyes:EOM are normal Neck: Normal range of motion  Cardiovascular: Normal rate Pulmonary/chest: Effort normal Neurologic: Patient is alert Skin: Skin is warm Psychiatric: Patient has normal mood and affect  Ortho Exam: Ortho exam demonstrates full range of motion of the left knee.  Grape sized cystic structure is present near the distal end of the medial aspect of the patellar tendon.  No knee effusion.  Collateral crucial ligaments are stable.  Pain slightly worse with knee flexion.  No proximal lymphadenopathy.  Specialty Comments:  No specialty comments available.  Imaging: US  Guided Needle Placement - No Linked Charges Result Date:  11/10/2023 Ultrasound imaging demonstrates needle placement into cystic structure around the left knee patellar tendon with aspiration of fluid and injection with no complicating features    PMFS History: Patient Active Problem List   Diagnosis Date Noted   Acute pancreatitis 11/05/2022   Alcohol dependence syndrome (HCC) 04/28/2021   Acute alcoholic pancreatitis 12/22/2020   Pancreatitis 12/21/2020   Seasonal and perennial allergic rhinitis 03/26/2018   Rhinitis medicamentosa 03/26/2018   Past Medical History:  Diagnosis Date   Alcohol dependence (HCC)    Alcoholic pancreatitis     History reviewed. No pertinent family history.  History reviewed. No pertinent surgical history. Social History   Occupational History   Occupation: Chartered certified accountant  Tobacco Use   Smoking status: Never   Smokeless tobacco: Never  Vaping Use   Vaping status: Every Day  Substance and Sexual Activity   Alcohol use: Yes    Comment: last drank 3 days ago   Drug use: Not Currently   Sexual activity: Not on file

## 2023-11-17 ENCOUNTER — Telehealth: Payer: Self-pay | Admitting: Orthopedic Surgery

## 2023-11-17 NOTE — Telephone Encounter (Signed)
 Left message providing my name and direct number for scheduled left knee arthroscopy, cyst decompression with Dr. Addie at [facility]

## 2023-11-25 ENCOUNTER — Telehealth: Payer: Self-pay | Admitting: Orthopedic Surgery

## 2023-11-25 NOTE — Telephone Encounter (Signed)
 Pt submitted medical release forms, disability forms, and $20.00 payment

## 2023-11-26 NOTE — Telephone Encounter (Signed)
 Received. Will complete when have date for surgery

## 2023-11-29 ENCOUNTER — Other Ambulatory Visit: Payer: Self-pay | Admitting: Surgical

## 2023-11-29 ENCOUNTER — Encounter: Payer: Self-pay | Admitting: Orthopedic Surgery

## 2023-11-29 DIAGNOSIS — M23022 Cystic meniscus, posterior horn of medial meniscus, left knee: Secondary | ICD-10-CM | POA: Diagnosis not present

## 2023-11-29 MED ORDER — OXYCODONE HCL 5 MG PO TABS: 5.0000 mg | ORAL_TABLET | Freq: Four times a day (QID) | ORAL | 0 refills | Status: AC | PRN

## 2023-11-29 MED ORDER — CELECOXIB 100 MG PO CAPS: 100.0000 mg | ORAL_CAPSULE | Freq: Two times a day (BID) | ORAL | 0 refills | Status: AC

## 2023-11-29 MED ORDER — METHOCARBAMOL 500 MG PO TABS: 500.0000 mg | ORAL_TABLET | Freq: Three times a day (TID) | ORAL | 1 refills | Status: AC | PRN

## 2023-11-29 MED ORDER — ASPIRIN 81 MG PO CHEW: 81.0000 mg | CHEWABLE_TABLET | Freq: Two times a day (BID) | ORAL | 0 refills | Status: AC

## 2023-12-06 ENCOUNTER — Encounter: Payer: Self-pay | Admitting: Surgical

## 2023-12-06 ENCOUNTER — Ambulatory Visit (INDEPENDENT_AMBULATORY_CARE_PROVIDER_SITE_OTHER): Admitting: Surgical

## 2023-12-06 DIAGNOSIS — Z9889 Other specified postprocedural states: Secondary | ICD-10-CM

## 2023-12-06 NOTE — Progress Notes (Signed)
   Post-Op Visit Note   Patient: Angel Prince           Date of Birth: [birth year]           MRN: [id number] Visit Date: 12/06/2023 PCP: Toribio Jerel MATSU, MD   Assessment & Plan:  Chief Complaint:  Chief Complaint  Patient presents with   Left Knee - Routine Post Op    11/29/2023 left knee arthroscopy, cyst debridement   Visit Diagnoses: No diagnosis found.  Plan: Patient is a 38 year old male who presents s/p left knee arthroscopy with cyst debridement on 11/29/2023.  Overall he is doing very well.  Not having a lot of pain.  Ambulating with 1 crutch for support but feels he does not need it.  Does not need any refills of his postop pain medicine.  No fevers or chills.  No chest pain, shortness of breath, calf pain.  On exam, patient has 3 degrees extension and 95 degrees of knee flexion.  Moderate effusion present.  Incisions look to be healing well without evidence of infection or dehiscence.  Sutures removed and replaced with Steri-Strips.  No calf tenderness.  Negative Homans' sign.  Palpable DP pulse.  Able to perform straight leg raise without extensor lag.  Plan at this time is to wean off crutches.  He will perform knee extension exercises to optimize his knee extension.  Continue with knee flexion exercises and straight leg raises as well as start stationary bike.  Follow-up in 4 weeks for clinical recheck and likely release at that time.  He is planning to return to work on Friday which should be okay but he was cautioned against kneeling and he understands that kneeling on this knee will be sore and painful for about 2 to 3 months likely.  Follow-Up Instructions: No follow-ups on file.   Orders:  No orders of the defined types were placed in this encounter.  No orders of the defined types were placed in this encounter.   Imaging: No results found.  PMFS History: Patient Active Problem List   Diagnosis Date Noted   Acute pancreatitis 11/05/2022   Alcohol dependence  syndrome (HCC) 04/28/2021   Acute alcoholic pancreatitis 12/22/2020   Pancreatitis 12/21/2020   Seasonal and perennial allergic rhinitis 03/26/2018   Rhinitis medicamentosa 03/26/2018   Past Medical History:  Diagnosis Date   Alcohol dependence (HCC)    Alcoholic pancreatitis     No family history on file.  No past surgical history on file. Social History   Occupational History   Occupation: Chartered certified accountant  Tobacco Use   Smoking status: Never   Smokeless tobacco: Never  Vaping Use   Vaping status: Every Day  Substance and Sexual Activity   Alcohol use: Yes    Comment: last drank 3 days ago   Drug use: Not Currently   Sexual activity: Not on file

## 2023-12-09 ENCOUNTER — Telehealth: Payer: Self-pay | Admitting: Orthopedic Surgery

## 2023-12-09 NOTE — Telephone Encounter (Signed)
 Patient called and said that he is suppose to go back to work but need a note to go back. CB#[phone]

## 2023-12-09 NOTE — Telephone Encounter (Signed)
 Okay for note for return to work. Only restriction is no kneeling

## 2023-12-18 NOTE — Telephone Encounter (Signed)
 3 months thx

## 2024-02-21 ENCOUNTER — Encounter: Payer: Self-pay | Admitting: Radiology
